# Patient Record
Sex: Female | Born: 1980 | Race: Black or African American | Hispanic: No | Marital: Married | Smoking: Former smoker
Health system: Southern US, Community
[De-identification: ages and names within clinical notes are randomized; demographics above are authoritative.]

## PROBLEM LIST (undated history)

## (undated) DIAGNOSIS — O009 Unspecified ectopic pregnancy without intrauterine pregnancy: Secondary | ICD-10-CM

## (undated) DIAGNOSIS — E119 Type 2 diabetes mellitus without complications: Secondary | ICD-10-CM

## (undated) DIAGNOSIS — I10 Essential (primary) hypertension: Secondary | ICD-10-CM

## (undated) DIAGNOSIS — T7840XA Allergy, unspecified, initial encounter: Secondary | ICD-10-CM

## (undated) HISTORY — DX: Essential (primary) hypertension: I10

## (undated) HISTORY — DX: Type 2 diabetes mellitus without complications: E11.9

## (undated) HISTORY — PX: TUBAL LIGATION: SHX77

## (undated) HISTORY — DX: Allergy, unspecified, initial encounter: T78.40XA

---

## 2001-08-29 ENCOUNTER — Other Ambulatory Visit: Admission: RE | Admit: 2001-08-29 | Discharge: 2001-08-29 | Payer: Self-pay | Admitting: Family Medicine

## 2003-06-24 ENCOUNTER — Encounter: Admission: RE | Admit: 2003-06-24 | Discharge: 2003-06-24 | Payer: Self-pay | Admitting: Obstetrics and Gynecology

## 2003-06-25 ENCOUNTER — Inpatient Hospital Stay (HOSPITAL_COMMUNITY): Admission: AD | Admit: 2003-06-25 | Discharge: 2003-06-25 | Payer: Self-pay | Admitting: Obstetrics and Gynecology

## 2003-08-08 ENCOUNTER — Other Ambulatory Visit: Admission: RE | Admit: 2003-08-08 | Discharge: 2003-08-08 | Payer: Self-pay | Admitting: Obstetrics and Gynecology

## 2003-08-08 ENCOUNTER — Inpatient Hospital Stay (HOSPITAL_COMMUNITY): Admission: AD | Admit: 2003-08-08 | Discharge: 2003-08-09 | Payer: Self-pay | Admitting: Obstetrics and Gynecology

## 2003-08-19 ENCOUNTER — Inpatient Hospital Stay (HOSPITAL_COMMUNITY): Admission: AD | Admit: 2003-08-19 | Discharge: 2003-08-20 | Payer: Self-pay | Admitting: Obstetrics and Gynecology

## 2003-08-29 ENCOUNTER — Inpatient Hospital Stay (HOSPITAL_COMMUNITY): Admission: AD | Admit: 2003-08-29 | Discharge: 2003-08-29 | Payer: Self-pay | Admitting: Obstetrics and Gynecology

## 2003-08-31 ENCOUNTER — Inpatient Hospital Stay (HOSPITAL_COMMUNITY): Admission: AD | Admit: 2003-08-31 | Discharge: 2003-09-02 | Payer: Self-pay | Admitting: Obstetrics and Gynecology

## 2003-09-12 ENCOUNTER — Inpatient Hospital Stay (HOSPITAL_COMMUNITY): Admission: AD | Admit: 2003-09-12 | Discharge: 2003-09-12 | Payer: Self-pay | Admitting: Obstetrics and Gynecology

## 2004-06-15 ENCOUNTER — Emergency Department (HOSPITAL_COMMUNITY): Admission: EM | Admit: 2004-06-15 | Discharge: 2004-06-15 | Payer: Self-pay | Admitting: Emergency Medicine

## 2004-09-03 ENCOUNTER — Other Ambulatory Visit: Admission: RE | Admit: 2004-09-03 | Discharge: 2004-09-03 | Payer: Self-pay | Admitting: Obstetrics and Gynecology

## 2005-03-04 ENCOUNTER — Encounter (HOSPITAL_COMMUNITY): Admission: RE | Admit: 2005-03-04 | Discharge: 2005-04-03 | Payer: Self-pay | Admitting: Obstetrics and Gynecology

## 2005-03-28 ENCOUNTER — Encounter: Admission: RE | Admit: 2005-03-28 | Discharge: 2005-03-28 | Payer: Self-pay | Admitting: Obstetrics and Gynecology

## 2005-04-08 ENCOUNTER — Encounter (HOSPITAL_COMMUNITY): Admission: RE | Admit: 2005-04-08 | Discharge: 2005-05-08 | Payer: Self-pay | Admitting: Obstetrics and Gynecology

## 2005-04-12 ENCOUNTER — Ambulatory Visit (HOSPITAL_COMMUNITY): Admission: RE | Admit: 2005-04-12 | Discharge: 2005-04-12 | Payer: Self-pay | Admitting: Obstetrics and Gynecology

## 2005-05-03 ENCOUNTER — Inpatient Hospital Stay (HOSPITAL_COMMUNITY): Admission: AD | Admit: 2005-05-03 | Discharge: 2005-05-04 | Payer: Self-pay | Admitting: Obstetrics and Gynecology

## 2005-05-20 ENCOUNTER — Encounter (HOSPITAL_COMMUNITY): Admission: AD | Admit: 2005-05-20 | Discharge: 2005-06-19 | Payer: Self-pay | Admitting: Obstetrics and Gynecology

## 2005-05-24 ENCOUNTER — Inpatient Hospital Stay (HOSPITAL_COMMUNITY): Admission: AD | Admit: 2005-05-24 | Discharge: 2005-05-30 | Payer: Self-pay | Admitting: Obstetrics and Gynecology

## 2005-05-25 ENCOUNTER — Ambulatory Visit: Payer: Self-pay | Admitting: Neonatology

## 2005-06-03 ENCOUNTER — Inpatient Hospital Stay (HOSPITAL_COMMUNITY): Admission: AD | Admit: 2005-06-03 | Discharge: 2005-06-03 | Payer: Self-pay | Admitting: Obstetrics and Gynecology

## 2005-06-14 ENCOUNTER — Inpatient Hospital Stay (HOSPITAL_COMMUNITY): Admission: AD | Admit: 2005-06-14 | Discharge: 2005-06-15 | Payer: Self-pay | Admitting: Obstetrics and Gynecology

## 2005-06-21 ENCOUNTER — Inpatient Hospital Stay (HOSPITAL_COMMUNITY): Admission: AD | Admit: 2005-06-21 | Discharge: 2005-06-22 | Payer: Self-pay | Admitting: Obstetrics and Gynecology

## 2005-06-25 ENCOUNTER — Inpatient Hospital Stay (HOSPITAL_COMMUNITY): Admission: AD | Admit: 2005-06-25 | Discharge: 2005-06-25 | Payer: Self-pay | Admitting: Obstetrics and Gynecology

## 2005-07-03 ENCOUNTER — Inpatient Hospital Stay (HOSPITAL_COMMUNITY): Admission: AD | Admit: 2005-07-03 | Discharge: 2005-07-03 | Payer: Self-pay | Admitting: Obstetrics and Gynecology

## 2005-07-05 ENCOUNTER — Inpatient Hospital Stay (HOSPITAL_COMMUNITY): Admission: AD | Admit: 2005-07-05 | Discharge: 2005-07-07 | Payer: Self-pay | Admitting: Obstetrics and Gynecology

## 2005-09-13 ENCOUNTER — Other Ambulatory Visit: Admission: RE | Admit: 2005-09-13 | Discharge: 2005-09-13 | Payer: Self-pay | Admitting: Obstetrics and Gynecology

## 2006-01-26 ENCOUNTER — Emergency Department (HOSPITAL_COMMUNITY): Admission: EM | Admit: 2006-01-26 | Discharge: 2006-01-26 | Payer: Self-pay | Admitting: Emergency Medicine

## 2014-07-13 ENCOUNTER — Encounter (HOSPITAL_COMMUNITY): Payer: Self-pay | Admitting: *Deleted

## 2014-07-13 ENCOUNTER — Emergency Department (HOSPITAL_COMMUNITY)
Admission: EM | Admit: 2014-07-13 | Discharge: 2014-07-13 | Disposition: A | Discharge status: Home / Self Care | Attending: Emergency Medicine | Admitting: Emergency Medicine

## 2014-07-13 DIAGNOSIS — L02212 Cutaneous abscess of back [any part, except buttock]: Secondary | ICD-10-CM | POA: Diagnosis not present

## 2014-07-13 DIAGNOSIS — R110 Nausea: Secondary | ICD-10-CM | POA: Insufficient documentation

## 2014-07-13 DIAGNOSIS — R630 Anorexia: Secondary | ICD-10-CM | POA: Insufficient documentation

## 2014-07-13 DIAGNOSIS — L089 Local infection of the skin and subcutaneous tissue, unspecified: Secondary | ICD-10-CM | POA: Diagnosis present

## 2014-07-13 DIAGNOSIS — Z3202 Encounter for pregnancy test, result negative: Secondary | ICD-10-CM | POA: Insufficient documentation

## 2014-07-13 DIAGNOSIS — Z720 Tobacco use: Secondary | ICD-10-CM | POA: Diagnosis not present

## 2014-07-13 LAB — POC URINE PREG, ED: Preg Test, Ur: NEGATIVE

## 2014-07-13 MED ORDER — SULFAMETHOXAZOLE-TRIMETHOPRIM 800-160 MG PO TABS: 1.0000 | ORAL_TABLET | Freq: Two times a day (BID) | ORAL | Status: AC

## 2014-07-13 MED ORDER — OXYCODONE-ACETAMINOPHEN 5-325 MG PO TABS
1.0000 | ORAL_TABLET | Freq: Once | ORAL | Status: AC
  Administered 2014-07-13: 1 via ORAL
  Filled 2014-07-13: qty 1

## 2014-07-13 MED ORDER — LIDOCAINE HCL (PF) 2 % IJ SOLN
0.0000 mL | Freq: Once | INTRAMUSCULAR | Status: AC | PRN
  Administered 2014-07-13: 10 mL via INTRADERMAL
  Filled 2014-07-13: qty 10

## 2014-07-13 NOTE — ED Notes (Signed)
Pt reports having an abcess on her rt shoulder blade. She first noticed this Wednesday and reports drainage this morning.

## 2014-07-13 NOTE — ED Provider Notes (Signed)
CSN: [id number]     Arrival date & time 07/13/14  1132 History  This chart was scribed for non-physician practitioner, Oswaldo ConroyVictoria Anjulie Dipierro, PA-C, working with Lorre NickAnthony Allen, MD, by Ronney LionSuzanne Le, ED Scribe. This patient was seen in room TR05C/TR05C and the patient's care was started at 1:57 PM.   Chief Complaint  Patient presents with  . Recurrent Skin Infections   The history is provided by the patient. No language interpreter was used.    HPI Comments: Kimberly Young is a 34 y.o. female who presents to the Emergency Department complaining of a constant boil on her right shoulder blade that she first noticed 3 days ago. She states that this morning, she woke up with brown discharge on her shirt. Patient complains of severe, associated pain to the area, nausea, and loss of appetite. Patient has tried wiping the area with alcohol, peroxide, and Neosporin. She had an abscess on her leg in the past before, but it was not as tender as this one. Patient has a history of gestational DM, but otherwise no DM. She denies vomiting. Patient has NKDA to antibiotics. She denies any likelihood of pregnancy, as she uses protection with sexual activity.  History reviewed. No pertinent past medical history. History reviewed. No pertinent past surgical history. No family history on file. History  Substance Use Topics  . Smoking status: Current Some Day Smoker  . Smokeless tobacco: Not on file  . Alcohol Use: Yes   OB History    No data available     Review of Systems  Constitutional: Positive for appetite change. Negative for fever.  Gastrointestinal: Positive for nausea. Negative for vomiting.  Neurological: Negative for seizures and headaches.      Allergies  Review of patient's allergies indicates no known allergies.  Home Medications   Prior to Admission medications   Medication Sig Start Date End Date Taking? Authorizing Provider  sulfamethoxazole-trimethoprim (BACTRIM DS,SEPTRA DS) 800-160 MG  per tablet Take 1 tablet by mouth 2 (two) times daily. 07/13/14 07/20/14  Oswaldo ConroyVictoria Blaiden Werth, PA-C   BP 122/68 mmHg  Pulse 74  Temp(Src) 97.8 F (36.6 C) (Oral)  Resp 16  Ht 5\' 6"  (1.676 m)  Wt 155 lb (70.308 kg)  BMI 25.03 kg/m2  SpO2 98% Physical Exam  Constitutional: She appears well-developed and well-nourished. No distress.  HENT:  Head: Normocephalic and atraumatic.  Eyes: Conjunctivae are normal. Right eye exhibits no discharge. Left eye exhibits no discharge.  Pulmonary/Chest: Effort normal. No respiratory distress.  Neurological: She is alert. Coordination normal.  Skin: She is not diaphoretic.  2-3 cm fluctuant abscess to right back near shoulder blade, with purulent drainage, surrounding induration, and evidence of moderate cellulitis. No red streaks.  Psychiatric: She has a normal mood and affect. Her behavior is normal.  Nursing note and vitals reviewed.   ED Course  Procedures (including critical care time)  DIAGNOSTIC STUDIES: Oxygen Saturation is 98% on RA, normal by my interpretation.    COORDINATION OF CARE: 2:01 PM - Discussed treatment plan with pt at bedside which includes I&D and Rx antibiotics, and pt agreed to plan.  INCISION AND DRAINAGE PROCEDURE NOTE: Patient identification was confirmed and verbal consent was obtained. This procedure was performed by Oswaldo ConroyVictoria Ory Elting, PA-C, working with Lorre NickAnthony Allen, MD at 2:20 PM. Site: right upper back Sterile procedures observed Needle size: 22 Anesthetic used (type and amt): 2% Lidocaine without Epinephrine Blade size: 11 Drainage: copious purulent Complexity: Complex Packing used Site anesthetized, incision made over site,  wound drained and explored loculations, rinsed with copious amounts of normal saline, wound packed with sterile gauze, covered with dry, sterile dressing.  Pt tolerated procedure well without complications.  Instructions for care discussed verbally and pt provided with additional  written instructions for homecare and f/u.   Meds given in ED:  Medications  lidocaine (XYLOCAINE) 2 % injection 0-20 mL (10 mLs Intradermal Given 07/13/14 1416)  oxyCODONE-acetaminophen (PERCOCET/ROXICET) 5-325 MG per tablet 1 tablet (1 tablet Oral Given 07/13/14 1453)    Discharge Medication List as of 07/13/2014  2:48 PM    START taking these medications   Details  sulfamethoxazole-trimethoprim (BACTRIM DS,SEPTRA DS) 800-160 MG per tablet Take 1 tablet by mouth 2 (two) times daily., Starting 07/13/2014, Until Sun 07/20/14, Print          MDM   Final diagnoses:  Abscess of back   Patient with skin abscess amenable to incision and drainage.  Abscess with packing in place. Wound recheck in 2 days. Pt tolerated procedure well. Encouraged home warm soaks and flushing.  Moderate signs of cellulitis is surrounding skin.  Will d/c to home with bactrim.  Discussed return precautions with patient. Patient verbalizes understanding and agrees with plan.  I personally performed the services described in this documentation, which was scribed in my presence. The recorded information has been reviewed and is accurate.   Oswaldo Conroy, PA-C 07/13/14 1650  Lorre Nick, MD 07/15/14 1137

## 2014-07-13 NOTE — Discharge Instructions (Signed)
Return to the emergency room with worsening of symptoms, new symptoms or with symptoms that are concerning, especially fevers, chills, vomiting, numbness, tingling. Please take all of your antibiotics until finished!   You may develop abdominal discomfort or diarrhea from the antibiotic.  You may help offset this with probiotics which you can buy or get in yogurt. Do not eat  or take the probiotics until 2 hours after your antibiotic.  Return to ED or urgent care or primary doctor in 2 days for wound recheck. Warm soaks three times daily Read below information and follow recommendations.   Abscess Care After An abscess (also called a boil or furuncle) is an infected area that contains a collection of pus. Signs and symptoms of an abscess include pain, tenderness, redness, or hardness, or you may feel a moveable soft area under your skin. An abscess can occur anywhere in the body. The infection may spread to surrounding tissues causing cellulitis. A cut (incision) by the surgeon was made over your abscess and the pus was drained out. Gauze may have been packed into the space to provide a drain that will allow the cavity to heal from the inside outwards. The boil may be painful for 5 to 7 days. Most people with a boil do not have high fevers. Your abscess, if seen early, may not have localized, and may not have been lanced. If not, another appointment may be required for this if it does not get better on its own or with medications. HOME CARE INSTRUCTIONS   Only take over-the-counter or prescription medicines for pain, discomfort, or fever as directed by your caregiver.  When you bathe, soak and then remove gauze or iodoform packs at least daily or as directed by your caregiver. You may then wash the wound gently with mild soapy water. Repack with gauze or do as your caregiver directs. SEEK IMMEDIATE MEDICAL CARE IF:   You develop increased pain, swelling, redness, drainage, or bleeding in the wound  site.  You develop signs of generalized infection including muscle aches, chills, fever, or a general ill feeling.  An oral temperature above 102 F (38.9 C) develops, not controlled by medication. See your caregiver for a recheck if you develop any of the symptoms described above. If medications (antibiotics) were prescribed, take them as directed. Document Released: 08/12/2004 Document Revised: 04/18/2011 Document Reviewed: 04/09/2007 [facility] Patient Information 2015 [employer] [place]. This information is not intended to replace advice given to you by your health care provider. Make sure you discuss any questions you have with your health care provider.

## 2014-07-13 NOTE — ED Notes (Signed)
Declined W/C at D/C and was escorted to lobby by RN. 

## 2014-07-13 NOTE — ED Notes (Signed)
Kimberly EisenmengerFelix in mini lab states POC urine test is negative.

## 2014-07-17 ENCOUNTER — Emergency Department (HOSPITAL_COMMUNITY)
Admission: EM | Admit: 2014-07-17 | Discharge: 2014-07-17 | Disposition: A | Discharge status: Home / Self Care | Attending: Emergency Medicine | Admitting: Emergency Medicine

## 2014-07-17 ENCOUNTER — Encounter (HOSPITAL_COMMUNITY): Payer: Self-pay | Admitting: *Deleted

## 2014-07-17 DIAGNOSIS — Z720 Tobacco use: Secondary | ICD-10-CM | POA: Insufficient documentation

## 2014-07-17 DIAGNOSIS — L02212 Cutaneous abscess of back [any part, except buttock]: Secondary | ICD-10-CM | POA: Insufficient documentation

## 2014-07-17 DIAGNOSIS — Z4801 Encounter for change or removal of surgical wound dressing: Secondary | ICD-10-CM | POA: Diagnosis present

## 2014-07-17 DIAGNOSIS — Z5189 Encounter for other specified aftercare: Secondary | ICD-10-CM

## 2014-07-17 HISTORY — DX: Unspecified ectopic pregnancy without intrauterine pregnancy: O00.90

## 2014-07-17 MED ORDER — LIDOCAINE HCL (PF) 1 % IJ SOLN
5.0000 mL | Freq: Once | INTRAMUSCULAR | Status: DC
  Filled 2014-07-17: qty 5

## 2014-07-17 NOTE — ED Provider Notes (Signed)
CSN: [id number]     Arrival date & time 07/17/14  0211 History   This chart was scribed for non-physician practitioner, Emilia Beck, PA-C working with Gilda Crease, MD by Freida Busman, ED Scribe. This patient was seen in room TR09C/TR09C and the patient's care was started at 7:30 PM.    Chief Complaint  Patient presents with  . Wound Check   The history is provided by the patient. No language interpreter was used.     HPI Comments:  Kimberly Young is a 34 y.o. female who presents to the Emergency Department for a wound check. Pt had an abscess to her right upper back incised and drained on 07/13/14. She states the pain to the area has improved but notes mild continued pain to the site. At this time she is complaining of significant drainage from the site. She has been changing the dressing 3-4 times a day.Pt has been taking her antibiotics as prescribed. She denies fever.    Past Medical History  Diagnosis Date  . Ectopic pregnancy    Past Surgical History  Procedure Laterality Date  . Tubal ligation Left     emergency   No family history on file. History  Substance Use Topics  . Smoking status: Current Some Day Smoker    Types: Cigarettes  . Smokeless tobacco: Not on file  . Alcohol Use: Yes   OB History    No data available     Review of Systems  Constitutional: Negative for fever.  Skin: Positive for wound (Right Upper Back).  All other systems reviewed and are negative.     Allergies  Review of patient's allergies indicates no known allergies.  Home Medications   Prior to Admission medications   Medication Sig Start Date End Date Taking? Authorizing Provider  sulfamethoxazole-trimethoprim (BACTRIM DS,SEPTRA DS) 800-160 MG per tablet Take 1 tablet by mouth 2 (two) times daily. 07/13/14 07/20/14  Oswaldo Conroy, PA-C   BP 116/76 mmHg  Pulse 95  Temp(Src) 98.4 F (36.9 C) (Oral)  Resp 16  Ht 5\' 6"  (1.676 m)  Wt 155 lb (70.308 kg)  BMI 25.03  kg/m2  SpO2 98%  LMP 07/13/2014 Physical Exam  Constitutional: She is oriented to person, place, and time. She appears well-developed and well-nourished. No distress.  HENT:  Head: Normocephalic and atraumatic.  Eyes: Conjunctivae and EOM are normal.  Neck: Normal range of motion. No tracheal deviation present.  Cardiovascular: Normal rate.   Pulmonary/Chest: Effort normal. No respiratory distress. She has no wheezes.  Abdominal: Soft. She exhibits no distension. There is no tenderness.  Musculoskeletal: Normal range of motion. She exhibits no tenderness.  Neurological: She is alert and oriented to person, place, and time. Coordination normal.  Skin: Skin is warm and dry. There is erythema.  6 x 6 cm area of erythema and edema and TTP with central incision wound and purulent drainage    Psychiatric: She has a normal mood and affect. Her behavior is normal.  Nursing note and vitals reviewed.   ED Course  Procedures   INCISION AND DRAINAGE Performed by: Emilia Beck Consent: Verbal consent obtained. Risks and benefits: risks, benefits and alternatives were discussed Type: abscess  Body area: right upper back  Anesthesia: local infiltration  Incision was made with a scalpel.  Local anesthetic: lidocaine 1% without epinephrine  Anesthetic total: 3 ml  Complexity: complex Blunt dissection to break up loculations  Drainage: purulent  Drainage amount: copious  Packing material: none  Patient  tolerance: Patient tolerated the procedure well with no immediate complications.     DIAGNOSTIC STUDIES:  Oxygen Saturation is 98% on RA, normal by my interpretation.    COORDINATION OF CARE:  7:33 PM Will open up the wound.  Discussed treatment plan with pt at bedside and pt agreed to plan.  Labs Review Labs Reviewed - No data to display  Imaging Review No results found.   EKG Interpretation None      MDM   Final diagnoses:  Wound check, abscess     Patient's abscess drained which produced more copious drainage. Patient instructed to continue antibiotics and return with worsening or concerning symptoms.   I personally performed the services described in this documentation, which was scribed in my presence. The recorded information has been reviewed and is accurate.      Emilia Beck, PA-C 07/17/14 2224  Gilda Crease, MD 07/18/14 [id number]

## 2014-07-17 NOTE — ED Notes (Addendum)
Pt had abscess lanced on Sunday.  She states pain greatly improved, but still has copious amount of purulent drainage.  Has been taking antibiotics as prescribed.

## 2015-10-02 ENCOUNTER — Ambulatory Visit (HOSPITAL_COMMUNITY)
Admission: EM | Admit: 2015-10-02 | Discharge: 2015-10-02 | Disposition: A | Discharge status: Home / Self Care | Attending: Family Medicine | Admitting: Family Medicine

## 2015-10-02 ENCOUNTER — Encounter (HOSPITAL_COMMUNITY): Payer: Self-pay | Admitting: Emergency Medicine

## 2015-10-02 DIAGNOSIS — Z87898 Personal history of other specified conditions: Secondary | ICD-10-CM

## 2015-10-02 DIAGNOSIS — R7309 Other abnormal glucose: Secondary | ICD-10-CM

## 2015-10-02 DIAGNOSIS — R030 Elevated blood-pressure reading, without diagnosis of hypertension: Secondary | ICD-10-CM

## 2015-10-02 DIAGNOSIS — IMO0001 Reserved for inherently not codable concepts without codable children: Secondary | ICD-10-CM

## 2015-10-02 LAB — POCT I-STAT, CHEM 8
BUN: 9 mg/dL (ref 6–20)
CALCIUM ION: 1.2 mmol/L (ref 1.13–1.30)
CHLORIDE: 97 mmol/L — AB (ref 101–111)
CREATININE: 0.4 mg/dL — AB (ref 0.44–1.00)
GLUCOSE: 382 mg/dL — AB (ref 65–99)
HCT: 41 % (ref 36.0–46.0)
Hemoglobin: 13.9 g/dL (ref 12.0–15.0)
Potassium: 4 mmol/L (ref 3.5–5.1)
Sodium: 134 mmol/L — ABNORMAL LOW (ref 135–145)
TCO2: 24 mmol/L (ref 0–100)

## 2015-10-02 NOTE — Discharge Instructions (Signed)
Your heart rate was normal today in office.  Your EKG and blood work was normal today.  There is no evidence of irregular heart beats/ arrhythmias.  Your electrolytes are within normal limits.  I suspect that your increased heart rate is associated with anxiety/ stress.  I recommend that you establish with a primary care doctor , who can do a more extensive investigation if you develop symptoms.  I recommend that you also have your blood pressure followed up on.  It was slightly elevated during today's visit.  Your blood sugar was also elevated.

## 2015-10-02 NOTE — ED Provider Notes (Signed)
CSN: [id number]     Arrival date & time 10/02/15  1249 History   First MD Initiated Contact with Patient 10/02/15 1345     Chief Complaint  Patient presents with  . Irregular Heart Beat   (Consider location/radiation/quality/duration/timing/severity/associated sxs/prior Treatment) HPI Patient reports that she was told by the plasma donation center that she needed to have her pulse evaluated, as it has been elevated the last couple of times.  She reports increased stress from losing her job, and notes that she was dependent upon the donation for income.  Denies palpitations, dizziness, CP, SOB, heavy menses, LOC.  No family h/o thyroid problems, irregular heart beats.  No medications, no drug use, social ETOH use.  Past Medical History:  Diagnosis Date  . Ectopic pregnancy    Past Surgical History:  Procedure Laterality Date  . TUBAL LIGATION Left    emergency   History reviewed. No pertinent family history. Social History  Substance Use Topics  . Smoking status: Current Some Day Smoker    Types: Cigarettes  . Smokeless tobacco: Never Used  . Alcohol use Yes   OB History    No data available     Review of Systems  Constitutional: Negative for appetite change, chills, fatigue and fever.  Eyes: Negative for visual disturbance.  Respiratory: Negative for chest tightness and shortness of breath.   Cardiovascular: Negative for chest pain, palpitations and leg swelling.  Gastrointestinal: Negative for constipation and diarrhea.  Endocrine: Negative for cold intolerance and heat intolerance.  Genitourinary: Negative for vaginal bleeding (no abnormal).  Skin: Negative for pallor.  Neurological: Negative for dizziness and syncope.  Psychiatric/Behavioral: The patient is nervous/anxious (has been more stressed lately).    Allergies  Review of patient's allergies indicates no known allergies.  Home Medications   Prior to Admission medications   Not on File   Meds Ordered and  Administered this Visit  Medications - No data to display  BP 136/96 (BP Location: Left Arm)   Pulse 95   Temp 98.4 F (36.9 C) (Oral)   Resp 16   LMP 09/08/2015   SpO2 100%  No data found.  Physical Exam  Constitutional: She is oriented to person, place, and time. She appears well-developed and well-nourished. No distress.  HENT:  Head: Normocephalic and atraumatic.  Eyes: Conjunctivae and EOM are normal.  Neck: Normal range of motion. Neck supple. No thyromegaly present.  Cardiovascular: Normal rate, regular rhythm, normal heart sounds and intact distal pulses.   No murmur heard. Pulmonary/Chest: Effort normal and breath sounds normal. No respiratory distress.  Musculoskeletal: Normal range of motion. She exhibits no edema.  Neurological: She is alert and oriented to person, place, and time.  Skin: Skin is warm. Capillary refill takes less than 2 seconds. No rash noted. She is not diaphoretic.  Psychiatric: She has a normal mood and affect. Her behavior is normal. Judgment and thought content normal.    Urgent Care Course   Clinical Course   1353: EKG and istat Chem 8 ordered  1405: EKG sinus, no arhythmia appreciated, HR 98  Procedures (including critical care time)  Labs Review Labs Reviewed  POCT I-STAT, CHEM 8 - Abnormal; Notable for the following:       Result Value   Sodium 134 (*)    Chloride 97 (*)    Creatinine, Ser 0.40 (*)    Glucose, Bld 382 (*)    All other components within normal limits    Imaging Review No results  found.   MDM   1. History of tachycardia   2. Elevated blood pressure   3. Elevated glucose     Kimberly Young is a 35 y.o. female that presents to UC for remote h/o tachycardia.  Her HR is normal at today's visit.  She is completely asymptomatic.  Her physical exam is unremarkable.  An EKG and electrolyte panel were obtained, EKG within normal limits.  Corrected sodium WNL.  Glucose elevated (patient was eating while in  room).  Suspect situation tachycardia related to anxiety.  Form provided by donation center signed.  Encouraged patient to establish with a PCP for continued monitoring of elevated BP.  This patient was discussed with Nils Pyle, NP, who agrees with my assessment and plan.  Geneva Barrero M. Nadine Counts, DO PGY-3, [facility]         Raliegh Ip, DO 10/02/15 [phone]

## 2015-10-02 NOTE — ED Notes (Signed)
Pt d/c by provider.

## 2015-10-02 NOTE — ED Triage Notes (Signed)
Pt reports elevated HR onset early July  Reports she was donating plasma and was told that she needs to be assessed for elevated HR  She is asymptomatic... Pulse today = 95  A&O x4... NAD

## 2016-09-24 ENCOUNTER — Ambulatory Visit (HOSPITAL_COMMUNITY)
Admission: EM | Admit: 2016-09-24 | Discharge: 2016-09-24 | Disposition: A | Discharge status: Home / Self Care | Payer: 59 | Attending: Family | Admitting: Family

## 2016-09-24 ENCOUNTER — Encounter (HOSPITAL_COMMUNITY): Payer: Self-pay | Admitting: Emergency Medicine

## 2016-09-24 DIAGNOSIS — L02212 Cutaneous abscess of back [any part, except buttock]: Secondary | ICD-10-CM | POA: Diagnosis not present

## 2016-09-24 DIAGNOSIS — R509 Fever, unspecified: Secondary | ICD-10-CM

## 2016-09-24 DIAGNOSIS — F17210 Nicotine dependence, cigarettes, uncomplicated: Secondary | ICD-10-CM | POA: Insufficient documentation

## 2016-09-24 DIAGNOSIS — R110 Nausea: Secondary | ICD-10-CM | POA: Diagnosis not present

## 2016-09-24 DIAGNOSIS — L0291 Cutaneous abscess, unspecified: Secondary | ICD-10-CM | POA: Diagnosis not present

## 2016-09-24 MED ORDER — MUPIROCIN 2 % EX OINT: 1.0000 "application " | TOPICAL_OINTMENT | Freq: Two times a day (BID) | CUTANEOUS | 0 refills | Status: DC

## 2016-09-24 MED ORDER — DOXYCYCLINE HYCLATE 100 MG PO TBEC: 100.0000 mg | DELAYED_RELEASE_TABLET | Freq: Two times a day (BID) | ORAL | 0 refills | Status: DC

## 2016-09-24 MED ORDER — LIDOCAINE-EPINEPHRINE (PF) 2 %-1:200000 IJ SOLN
INTRAMUSCULAR | Status: AC
  Filled 2016-09-24: qty 20

## 2016-09-24 NOTE — ED Triage Notes (Signed)
Pt here for abscess on back onset 3 days that's getting bigger and painful associated w/bloody drainage and fevers, chills, nausea  Denies vomiting, diarrhea  A&O x4... NAD... Ambulatory

## 2016-09-24 NOTE — Discharge Instructions (Signed)
Please return in 2 days for wound check  Continue to use warm compresses to encourage more purulent discharge to drain. As discussed, once this heals, may use Dial soap on a  regular basis and if you start to feel any beginnings of abscess, may start to use topical mupirocin ointment which I prescribed today.   Start doxycycline today. As discussed, do not a lot of time in the sinus medication. Easier for you to get a sunburn.  Ensure to take probiotics while on antibiotics and also for 2 weeks after completion. It is important to re-colonize the gut with good bacteria and also to prevent any diarrheal infections associated with antibiotic use.   If there is no improvement in your symptoms, or if there is any worsening of symptoms, or if you have any additional concerns, please return for re-evaluation; or, if we are closed, consider going to the Emergency Room for evaluation if symptoms urgent.

## 2016-09-24 NOTE — ED Provider Notes (Signed)
[facility]    CSN: [id number] Arrival date & time: 09/24/16  1204     History   Chief Complaint Chief Complaint  Patient presents with  . Abscess    HPI Kimberly Young is a 36 y.o. female.   Chief complaint: abscess on upper back, 5 days, worsening. Patient endorses bloody discharge, chills, fever, nausea NO tick. Unsure if got 'pinched' . Hasn't tried any medications; has tried warm compresses with some relief.  No concern for pregnancy  No h/o mrsa        Past Medical History:  Diagnosis Date  . Ectopic pregnancy     There are no active problems to display for this patient.   Past Surgical History:  Procedure Laterality Date  . TUBAL LIGATION Left    emergency    OB History    No data available       Home Medications    Prior to Admission medications   Medication Sig Start Date End Date Taking? Authorizing Provider  doxycycline (DORYX) 100 MG EC tablet Take 1 tablet (100 mg total) by mouth 2 (two) times daily. 09/24/16   Allegra Grana, FNP  mupirocin ointment (BACTROBAN) 2 % Place 1 application into the nose 2 (two) times daily. 09/24/16   Allegra Grana, FNP    Family History History reviewed. No pertinent family history.  Social History Social History  Substance Use Topics  . Smoking status: Current Some Day Smoker    Types: Cigarettes  . Smokeless tobacco: Never Used  . Alcohol use Yes     Allergies   Patient has no known allergies.   Review of Systems Review of Systems  Constitutional: Positive for fever. Negative for chills.  Respiratory: Negative for cough.   Cardiovascular: Negative for chest pain and palpitations.  Gastrointestinal: Positive for nausea. Negative for vomiting.     Physical Exam Triage Vital Signs ED Triage Vitals [09/24/16 1229]  Enc Vitals Group     BP 129/78     Pulse Rate 99     Resp 20     Temp 100.2 F (37.9 C)     Temp Source Oral     SpO2 98 %     Weight      Height        Head Circumference      Peak Flow      Pain Score 10     Pain Loc      Pain Edu?      Excl. in GC?    No data found.   Updated Vital Signs BP 129/78 (BP Location: Left Arm)   Pulse 99   Temp 100.2 F (37.9 C) (Oral)   Resp 20   LMP 09/24/2016   SpO2 98%   Visual Acuity Right Eye Distance:   Left Eye Distance:   Bilateral Distance:    Right Eye Near:   Left Eye Near:    Bilateral Near:     Physical Exam  Constitutional: She appears well-developed and well-nourished.  Eyes: Conjunctivae are normal.  Cardiovascular: Normal rate, regular rhythm, normal heart sounds and normal pulses.   Pulmonary/Chest: Effort normal and breath sounds normal. She has no wheezes. She has no rhonchi. She has no rales.  Neurological: She is alert.  Skin: Skin is warm and dry.     Psychiatric: She has a normal mood and affect. Her speech is normal and behavior is normal. Thought content normal.  Vitals reviewed.   Patient does  not have previous history of MRSA.  Patient does not have diabetes.   The patient gave informed consent for the procedure. Patient and I discussed risks, benefits, and alternatives to I & D.  Including risk of infection from laceration, localized pain, and bleeding. We discussed the option for oral antibiotics. Patient verbalized understanding to conversation and all questions were answered. We jointly decided to proceed with procedure.  On exam, there is a 4cm cm indurated abscess.  It is tender to palpation.  There is erythema with spontaneous purulent discharge discharge.  The patient gave informed consent for the procedure. The area was prepped with antiseptic solution. The area was anesthetized using lidocaine. The patient tolerated the anesthetic well.    #11 blade was used to incise the abscess.  Purulent bloody drainage was noted.  Bleeding from the wound was controlled by applying direct pressure with gauze.   Iodoform was used to pack the abscess.  A  bandage was placed.  Wound culture was obtained.  The patient tolerated the procedure well.  Extensive instructions were given to the patient.   Complications: None  Take antibiotic as directed.  Follow-up for wound check in 2-3 days.   UC Treatments / Results  Labs (all labs ordered are listed, but only abnormal results are displayed) Labs Reviewed  AEROBIC CULTURE (SUPERFICIAL SPECIMEN)    EKG  EKG Interpretation None       Radiology No results found.  Procedures Procedures (including critical care time)  Medications Ordered in UC Medications - No data to display   Initial Impression / Assessment and Plan / UC Course  I have reviewed the triage vital signs and the nursing notes.  Pertinent labs & imaging results that were available during my care of the patient were reviewed by me and considered in my medical decision making (see chart for details).       Final Clinical Impressions(s) / UC Diagnoses   Final diagnoses:  Abscess  Low-grade temperature today suspect from localized infection.. Patient is well-appearing. She tolerated incision and drainage well.  We'll also start on doxycycline today. Mupirocin given to patient for future abscess and education given on prevention ( dial soap). Encouraged to continue use warm compresses. Patient understands to follow-up for wound recheck in 1-2 days. Return precautions given.  New Prescriptions New Prescriptions   DOXYCYCLINE (DORYX) 100 MG EC TABLET    Take 1 tablet (100 mg total) by mouth 2 (two) times daily.   MUPIROCIN OINTMENT (BACTROBAN) 2 %    Place 1 application into the nose 2 (two) times daily.     Controlled Substance Prescriptions [place] Controlled Substance Registry consulted? Not Applicable   Allegra Grana, [place] 09/24/16 [phone]

## 2016-09-27 LAB — AEROBIC CULTURE W GRAM STAIN (SUPERFICIAL SPECIMEN)

## 2016-09-27 LAB — AEROBIC CULTURE  (SUPERFICIAL SPECIMEN)

## 2016-09-28 ENCOUNTER — Ambulatory Visit (HOSPITAL_COMMUNITY)
Admission: EM | Admit: 2016-09-28 | Discharge: 2016-09-28 | Disposition: A | Discharge status: Home / Self Care | Payer: 59 | Attending: Family Medicine | Admitting: Family Medicine

## 2016-09-28 ENCOUNTER — Encounter (HOSPITAL_COMMUNITY): Payer: Self-pay | Admitting: Emergency Medicine

## 2016-09-28 DIAGNOSIS — Z09 Encounter for follow-up examination after completed treatment for conditions other than malignant neoplasm: Secondary | ICD-10-CM

## 2016-09-28 DIAGNOSIS — Z5189 Encounter for other specified aftercare: Secondary | ICD-10-CM | POA: Diagnosis not present

## 2016-09-28 DIAGNOSIS — L02212 Cutaneous abscess of back [any part, except buttock]: Secondary | ICD-10-CM

## 2016-09-28 MED ORDER — HYDROCODONE-ACETAMINOPHEN 5-325 MG PO TABS: 1.0000 | ORAL_TABLET | ORAL | 0 refills | Status: DC | PRN

## 2016-09-28 NOTE — ED Triage Notes (Signed)
Pt here for abscess recheck.  Pt had an I&D done on Saturday and has been taking antibiotics as prescribed.  Pt reports pain and yellow discharge from the wound and continued fever.

## 2016-09-28 NOTE — Discharge Instructions (Signed)
He may change the dressing but try not to pull out the wick/packing. You may wash around the wound with water and alcohol. Replace with clean gauze for absorption. For any worsening, fever, increase in size return promptly. Otherwise, follow-up in 2-2-1/2 days. Continue the doxycycline. You have pain medicine prescribed to you that can cause drowsiness. Try not to get the dressing wet.

## 2016-09-28 NOTE — ED Provider Notes (Signed)
[facility]    CSN: [id number] Arrival date & time: 09/28/16  1001     History   Chief Complaint Chief Complaint  Patient presents with  . Wound Check    HPI Kimberly Young is a 36 y.o. female.   36 year old female presents today for wound check 4 days after she underwent an incision and drainage of an abscess to her right back. She states it is feeling worse. It is increased in size, pain is increased and Tylenol and ibuprofen and are not helping.      Past Medical History:  Diagnosis Date  . Ectopic pregnancy     There are no active problems to display for this patient.   Past Surgical History:  Procedure Laterality Date  . TUBAL LIGATION Left    emergency    OB History    No data available       Home Medications    Prior to Admission medications   Medication Sig Start Date End Date Taking? Authorizing Provider  doxycycline (DORYX) 100 MG EC tablet Take 1 tablet (100 mg total) by mouth 2 (two) times daily. 09/24/16  Yes Arnett, Lyn Records, FNP  mupirocin ointment (BACTROBAN) 2 % Place 1 application into the nose 2 (two) times daily. 09/24/16  Yes Arnett, Lyn Records, FNP  HYDROcodone-acetaminophen (NORCO/VICODIN) 5-325 MG tablet Take 1 tablet by mouth every 4 (four) hours as needed. 09/28/16   Hayden Rasmussen, NP    Family History History reviewed. No pertinent family history.  Social History Social History  Substance Use Topics  . Smoking status: Current Some Day Smoker    Types: Cigarettes  . Smokeless tobacco: Never Used  . Alcohol use Yes     Allergies   Patient has no known allergies.   Review of Systems Review of Systems  Constitutional: Negative.   Gastrointestinal: Negative.   Skin:       Abscess formation of the back.  Neurological: Negative.   All other systems reviewed and are negative.    Physical Exam Triage Vital Signs ED Triage Vitals [09/28/16 1032]  Enc Vitals Group     BP 108/76     Pulse Rate 92     Resp       Temp 98.5 F (36.9 C)     Temp Source Oral     SpO2 98 %     Weight      Height      Head Circumference      Peak Flow      Pain Score 6     Pain Loc      Pain Edu?      Excl. in GC?    No data found.   Updated Vital Signs BP 108/76 (BP Location: Left Arm)   Pulse 92   Temp 98.5 F (36.9 C) (Oral)   LMP 09/24/2016   SpO2 98%   Visual Acuity Right Eye Distance:   Left Eye Distance:   Bilateral Distance:    Right Eye Near:   Left Eye Near:    Bilateral Near:     Physical Exam  Constitutional: She is oriented to Young, place, and time. She appears well-developed and well-nourished. No distress.  Neck: Neck supple.  Cardiovascular: Normal rate.   Pulmonary/Chest: Effort normal.  Musculoskeletal: Normal range of motion. She exhibits no deformity.  Neurological: She is alert and oriented to Young, place, and time.  Skin: Skin is warm. No rash noted.  The area of abscess that  was I&D 4 days ago has closed, it has increased in size and is formed a rather large pocket of pus, tenderness and discoloration. Surrounding minor erythema.Marland Kitchen Positive for fluctuance and tenderness. No evidence of packing.  Nursing note and vitals reviewed.    UC Treatments / Results  Labs (all labs ordered are listed, but only abnormal results are displayed) Labs Reviewed - No data to display  EKG  EKG Interpretation None       Radiology No results found.  Procedures .Marland KitchenIncision and Drainage Date/Time: 09/28/2016 11:47 AM Performed by: Phineas Real, Simrah Chatham Authorized by: Mardella Layman   Consent:    Consent obtained:  Verbal   Consent given by:  Patient   Risks discussed:  Bleeding, pain and infection Location:    Type:  Abscess   Location:  Trunk   Trunk location:  Back Pre-procedure details:    Skin preparation:  Betadine Anesthesia (see MAR for exact dosages):    Anesthesia method:  Local infiltration   Local anesthetic:  Lidocaine 2% w/o epi and bupivacaine 0.5% WITH  epi Procedure type:    Complexity:  Complex Procedure details:    Needle aspiration: no     Incision types:  Single with marsupialization   Incision depth:  Subcutaneous   Scalpel blade:  11   Wound management:  Probed and deloculated   Drainage:  Bloody and purulent   Drainage amount:  Copious   Wound treatment:  Drain placed   Packing materials:  1/4 in gauze Post-procedure details:    Patient tolerance of procedure:  Tolerated well, no immediate complications Comments:     Copious amount of purulence was expressed from the abscess.   (including critical care time)  Medications Ordered in UC Medications - No data to display   Initial Impression / Assessment and Plan / UC Course  I have reviewed the triage vital signs and the nursing notes.  Pertinent labs & imaging results that were available during my care of the patient were reviewed by me and considered in my medical decision making (see chart for details).     He may change the dressing but try not to pull out the wick/packing. You may wash around the wound with water and alcohol. Replace with clean gauze for absorption. For any worsening, fever, increase in size return promptly. Otherwise, follow-up in 2-2-1/2 days. Continue the doxycycline. You have pain medicine prescribed to you that can cause drowsiness. Try not to get the dressing wet.   Final Clinical Impressions(s) / UC Diagnoses   Final diagnoses:  Wound check, abscess  Encounter for recheck of abscess following incision and drainage    New Prescriptions New Prescriptions   HYDROCODONE-ACETAMINOPHEN (NORCO/VICODIN) 5-325 MG TABLET    Take 1 tablet by mouth every 4 (four) hours as needed.     Controlled Substance Prescriptions [place] Controlled Substance Registry consulted? Yes, I have consulted the [place] Controlled Substances Registry for this patient, and feel the risk/benefit ratio today is favorable for proceeding with this prescription for a controlled  substance.   Hayden Rasmussen, NP 09/28/16 1156

## 2017-10-06 ENCOUNTER — Emergency Department (HOSPITAL_COMMUNITY)
Admission: EM | Admit: 2017-10-06 | Discharge: 2017-10-06 | Disposition: A | Discharge status: Home / Self Care | Payer: 59 | Attending: Emergency Medicine | Admitting: Emergency Medicine

## 2017-10-06 ENCOUNTER — Encounter (HOSPITAL_COMMUNITY): Payer: Self-pay

## 2017-10-06 ENCOUNTER — Emergency Department (HOSPITAL_COMMUNITY): Payer: 59

## 2017-10-06 ENCOUNTER — Other Ambulatory Visit: Payer: Self-pay

## 2017-10-06 DIAGNOSIS — F17210 Nicotine dependence, cigarettes, uncomplicated: Secondary | ICD-10-CM | POA: Diagnosis not present

## 2017-10-06 DIAGNOSIS — M25562 Pain in left knee: Secondary | ICD-10-CM | POA: Diagnosis not present

## 2017-10-06 DIAGNOSIS — M25512 Pain in left shoulder: Secondary | ICD-10-CM | POA: Insufficient documentation

## 2017-10-06 MED ORDER — ACETAMINOPHEN 500 MG PO TABS: 500.0000 mg | ORAL_TABLET | Freq: Four times a day (QID) | ORAL | 0 refills | Status: DC | PRN

## 2017-10-06 MED ORDER — IBUPROFEN 600 MG PO TABS: 600.0000 mg | ORAL_TABLET | Freq: Four times a day (QID) | ORAL | 0 refills | Status: DC | PRN
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
## 2017-12-08 ENCOUNTER — Encounter: Payer: 59 | Attending: *Deleted | Admitting: Registered"

## 2017-12-08 ENCOUNTER — Encounter: Payer: Self-pay | Admitting: Registered"

## 2017-12-08 DIAGNOSIS — Z713 Dietary counseling and surveillance: Secondary | ICD-10-CM | POA: Insufficient documentation

## 2017-12-08 DIAGNOSIS — E119 Type 2 diabetes mellitus without complications: Secondary | ICD-10-CM | POA: Diagnosis not present

## 2017-12-08 NOTE — Patient Instructions (Signed)
-   Add protein to breakfast option.   - Add in lunch option between breakfast and dinner. See page 15 in book as guide to include protein, vegetables, and carbohydrates.

## 2017-12-08 NOTE — Progress Notes (Signed)
Diabetes Self-Management Education  Visit Type:  First/Initial  Appt. Start Time: 9:23 Appt. End Time: 10:15  12/08/2017  [name], identified by name and date of birth, is a 37 y.o. female with a diagnosis of Diabetes: Type 2.   ASSESSMENT  Pt expectations: meal options, what she can/cannot eat, awareness of diabetes-related complications  Pt states she had gestational diabetes with 2 children. Pt states is currently not checking blood sugar but will begin once she returns to doctor in Dec after assessing how medication and behavioral changes are affecting her.   Pt states she works at [employer] [place] M-F 1-10pm, loves it.  Pt states she typically sleeps about 8 hours/night; wakes up at 9:30am. Pt reports eating breakfast (cereal) soon after waking. Pt states her mom lives with her and cooks sometimes. Pt states she was drinking a lot of juice and sodas prior to being diagnosed with diabetes, has made changes since then.   Next visit: physical activity, chronic complications, etc. (pg 16+ in book)  There were no vitals taken for this visit. There is no height or weight on file to calculate BMI.   Diabetes Self-Management Education - 12/08/17 0928      Health Coping   How would you rate your overall health?  Good      Psychosocial Assessment   Patient Belief/Attitude about Diabetes  Motivated to manage diabetes    Self-care barriers  None    Self-management support  Family;Friends    Patient Concerns  Nutrition/Meal planning    Special Needs  None    Preferred Learning Style  No preference indicated    Learning Readiness  Ready      Complications   Last HgB A1C per patient/outside source  14 %    How often do you check your blood sugar?  0 times/day (not testing)    Number of hypoglycemic episodes per month  0    Number of hyperglycemic episodes per week  1   only during menstrual cycle   Can you tell when your blood sugar is high?  No    Have you had a dilated  eye exam in the past 12 months?  Yes    Have you had a dental exam in the past 12 months?  Yes    Are you checking your feet?  Yes    How many days per week are you checking your feet?  7      Dietary Intake   Breakfast  cereal     Snack (morning)  none    Lunch  tangerine or nut grain snack or fruit snack from vending machine    Snack (afternoon)  none    Dinner  burger + side salad or chicken + rice + green beans    Snack (evening)  sometimes chips    Beverage(s)  unsweetened almond milk, water, diet coke, diet ginger ale      Exercise   Exercise Type  ADL's    How many days per week to you exercise?  0    How many minutes per day do you exercise?  0    Total minutes per week of exercise  0      Patient Education   Previous Diabetes Education  Yes (please comment)   2013   Disease state   Definition of diabetes, type 1 and 2, and the diagnosis of diabetes;Factors that contribute to the development of diabetes    Nutrition management  Role of diet in the treatment of diabetes and the relationship between the three main macronutrients and blood glucose level;Food label reading, portion sizes and measuring food.;Carbohydrate counting;Reviewed blood glucose goals for pre and post meals and how to evaluate the patients' food intake on their blood glucose level.;Information on hints to eating out and maintain blood glucose control.;Meal options for control of blood glucose level and chronic complications.    Physical activity and exercise   Role of exercise on diabetes management, blood pressure control and cardiac health.    Monitoring  Purpose and frequency of SMBG.;Taught/discussed recording of test results and interpretation of SMBG.;Identified appropriate SMBG and/or A1C goals.;Interpreting lab values - A1C, lipid, urine microalbumina.    Acute complications  Taught treatment of hypoglycemia - the 15 rule.;Discussed and identified patients' treatment of hyperglycemia.    Chronic  complications  Lipid levels, blood glucose control and heart disease    Psychosocial adjustment  Role of stress on diabetes;Helped patient identify a support system for diabetes management      Individualized Goals (developed by patient)   Nutrition  Follow meal plan discussed;General guidelines for healthy choices and portions discussed    Physical Activity  Not Applicable    Medications  take my medication as prescribed    Monitoring   Other (comment)    Reducing Risk  examine blood glucose patterns;increase portions of healthy fats;treat hypoglycemia with 15 grams of carbs if blood glucose less than 70mg /dL;increase portions of nuts and seeds;increase portions of olive oil in diet    Health Coping  Not Applicable      Post-Education Assessment   Patient understands the diabetes disease and treatment process.  Demonstrates understanding / competency    Patient understands incorporating nutritional management into lifestyle.  Demonstrates understanding / competency    Patient undertands incorporating physical activity into lifestyle.  Needs Review    Patient understands using medications safely.  Demonstrates understanding / competency    Patient understands monitoring blood glucose, interpreting and using results  Needs Review    Patient understands prevention, detection, and treatment of acute complications.  Demonstrates understanding / competency    Patient understands prevention, detection, and treatment of chronic complications.  Needs Instruction    Patient understands how to develop strategies to address psychosocial issues.  Demonstrates understanding / competency    Patient understands how to develop strategies to promote health/change behavior.  Needs Instruction      Outcomes   Program Status  Not Completed       Learning Objective:  Patient will have a greater understanding of diabetes self-management. Patient education plan is to attend individual and/or group sessions per  assessed needs and concerns.   Plan:   Patient Instructions  - Add protein to breakfast option.   - Add in lunch option between breakfast and dinner. See page 15 in book as guide to include protein, vegetables, and carbohydrates.       Expected Outcomes:  Demonstrated interest in learning. Expect positive outcomes  Education material provided: ADA Diabetes: Your Take Control Guide, Support group flyer and Carbohydrate counting sheet  If problems or questions, patient to contact team via:  Phone and Email  Future DSME appointment: - 2 wks

## 2017-12-22 ENCOUNTER — Ambulatory Visit: Payer: 59 | Admitting: Registered"

## 2018-05-10 ENCOUNTER — Ambulatory Visit (INDEPENDENT_AMBULATORY_CARE_PROVIDER_SITE_OTHER): Payer: 59 | Admitting: Neurology

## 2018-05-10 ENCOUNTER — Encounter: Payer: Self-pay | Admitting: Neurology

## 2018-05-10 ENCOUNTER — Other Ambulatory Visit: Payer: Self-pay

## 2018-05-10 DIAGNOSIS — R269 Unspecified abnormalities of gait and mobility: Secondary | ICD-10-CM | POA: Diagnosis not present

## 2018-05-10 DIAGNOSIS — R3915 Urgency of urination: Secondary | ICD-10-CM | POA: Diagnosis not present

## 2018-05-10 DIAGNOSIS — R202 Paresthesia of skin: Secondary | ICD-10-CM | POA: Diagnosis not present

## 2018-05-10 NOTE — Progress Notes (Signed)
PATIENT: Kimberly [name] DOB: 04/20/1980   Virtual Visit via Video  I connected with Kimberly [name] on 05/10/18 at  by video and verified that I am speaking with the correct [name] using two identifiers.   I discussed the limitations, risks, security and privacy concerns of performing an evaluation and management service by video and the availability of in [name] appointments. I also discussed with the patient that there may be a patient responsible charge related to this service. The patient expressed understanding and agreed to proceed.   History of Present Illness: Kimberly [name] is a 38 year old female, referred by her primary care PA Kimberly [name] for evaluation of difficulty walking,  She had past medical history of diabetes since October 2019, taking Metformin 500 mg twice a day,  She was noted to have gradual onset of difficulty walking since 2018, gradually getting worse, become more obvious since October 2019, she described difficulty climbing up stairs, she has to hold onto handrails, 1 step at a time, complains of bilateral lower extremity feeling tired, heavy, she denies significant upper extremity paresthesia or weakness, able to raise arm overhead without difficulty, she denies significant neck pain,  She denies droopy eyelid, ptosis, swallowing difficulties, no visual loss, no hearing loss, complains of bilateral feet paresthesia, also noticed recent onset urinary and bowel urgency, occasionally accident  MRI of the brain with without contrast at [facility] on April 30, 2018 was normal.  Observations/Objective: I have reviewed problem lists, medications, allergies.  Assessment and Plan: Gradual onset gait abnormality since 2018, progressive worsening,  Normal MRI of the brain with without contrast in March 2020  Minimum sensory complaints, mild bilateral feet paresthesia, also complains of bowel and bladder urgency, occasional incontinence  Potential localization are  cervical, thoracic spinal cord,  Differentiation diagnosis also include myopathy, inflammatory, infectious etiology  Laboratory evaluation first, if there is no significant abnormality found by laboratory evaluation, may consider MRI imaging study of the cervical and thoracic spine  Follow Up Instructions:   I will call her laboratory evaluations,   I discussed the assessment and treatment plan with the patient. The patient was provided an opportunity to ask questions and all were answered. The patient agreed with the plan and demonstrated an understanding of the instructions.   The patient was advised to call back or seek an in-[name] evaluation if the symptoms worsen or if the condition fails to improve as anticipated.  I provided 30 minutes of non-face-to-face time during this encounter.   Levert Feinstein, MD  REVIEW OF SYSTEMS: Full 14 system review of systems performed and notable only for as above All other review of systems were negative.  ALLERGIES: No Known Allergies  HOME MEDICATIONS: Current Outpatient Medications  Medication Sig Dispense Refill  . acetaminophen (TYLENOL) 500 MG tablet Take 1 tablet (500 mg total) by mouth every 6 (six) hours as needed. 30 tablet 0  . doxycycline (DORYX) 100 MG EC tablet Take 1 tablet (100 mg total) by mouth 2 (two) times daily. 20 tablet 0  . HYDROcodone-acetaminophen (NORCO/VICODIN) 5-325 MG tablet Take 1 tablet by mouth every 4 (four) hours as needed. 20 tablet 0  . ibuprofen (ADVIL,MOTRIN) 600 MG tablet Take 1 tablet (600 mg total) by mouth every 6 (six) hours as needed. 30 tablet 0  . metFORMIN (GLUCOPHAGE) 500 MG tablet Take by mouth 2 (two) times daily with a meal.    . mupirocin ointment (BACTROBAN) 2 % Place 1 application into the nose 2 (two) times daily.  22 g 0   No current facility-administered medications for this visit.     PAST MEDICAL HISTORY: Past Medical History:  Diagnosis Date  . Diabetes mellitus without complication  (HCC)   . Ectopic pregnancy     PAST SURGICAL HISTORY: Past Surgical History:  Procedure Laterality Date  . TUBAL LIGATION Left    emergency    FAMILY HISTORY: Family History  Problem Relation Age of Onset  . Diabetes Other   . Stroke Other     SOCIAL HISTORY: Social History   Socioeconomic History  . Marital status: Married    Spouse name: Not on file  . Number of children: Not on file  . Years of education: Not on file  . Highest education level: Not on file  Occupational History  . Not on file  Social Needs  . Financial resource strain: Not on file  . Food insecurity:    Worry: Not on file    Inability: Not on file  . Transportation needs:    Medical: Not on file    Non-medical: Not on file  Tobacco Use  . Smoking status: Current Some Day Smoker    Types: Cigarettes  . Smokeless tobacco: Never Used  Substance and Sexual Activity  . Alcohol use: Yes  . Drug use: No  . Sexual activity: Yes    Birth control/protection: Condom  Lifestyle  . Physical activity:    Days per week: Not on file    Minutes per session: Not on file  . Stress: Not on file  Relationships  . Social connections:    Talks on phone: Not on file    Gets together: Not on file    Attends religious service: Not on file    Active member of club or organization: Not on file    Attends meetings of clubs or organizations: Not on file    Relationship status: Not on file  . Intimate partner violence:    Fear of current or ex partner: Not on file    Emotionally abused: Not on file    Physically abused: Not on file    Forced sexual activity: Not on file  Other Topics Concern  . Not on file  Social History Narrative  . Not on file

## 2018-05-23 ENCOUNTER — Ambulatory Visit: Payer: 59 | Admitting: Neurology

## 2018-06-18 ENCOUNTER — Telehealth: Payer: Self-pay | Admitting: Neurology

## 2018-06-18 NOTE — Telephone Encounter (Signed)
Trying to get patient in this week with Dr. Terrace Arabia.  Levert Feinstein, MD  [facility]        With Terrace Arabia 4 weeks

## 2018-06-19 NOTE — Telephone Encounter (Signed)
Called and scheduled patient a message asking her to call me back about follow up visit for this week with Dr. Terrace Arabia . Left her my direct number.

## 2018-12-14 ENCOUNTER — Other Ambulatory Visit: Payer: Self-pay

## 2018-12-14 DIAGNOSIS — Z20822 Contact with and (suspected) exposure to covid-19: Secondary | ICD-10-CM

## 2018-12-15 LAB — NOVEL CORONAVIRUS, NAA: SARS-CoV-2, NAA: NOT DETECTED

## 2018-12-25 ENCOUNTER — Other Ambulatory Visit: Payer: Self-pay

## 2018-12-25 DIAGNOSIS — Z20822 Contact with and (suspected) exposure to covid-19: Secondary | ICD-10-CM

## 2018-12-26 ENCOUNTER — Encounter: Payer: Self-pay | Admitting: General Surgery

## 2018-12-26 ENCOUNTER — Ambulatory Visit: Payer: 59 | Admitting: Gastroenterology

## 2018-12-26 ENCOUNTER — Telehealth: Payer: Self-pay | Admitting: General Surgery

## 2018-12-26 NOTE — Telephone Encounter (Signed)
No show

## 2018-12-27 LAB — NOVEL CORONAVIRUS, NAA: SARS-CoV-2, NAA: NOT DETECTED

## 2020-10-13 ENCOUNTER — Encounter: Payer: Self-pay | Admitting: Internal Medicine

## 2020-10-21 ENCOUNTER — Encounter (INDEPENDENT_AMBULATORY_CARE_PROVIDER_SITE_OTHER): Payer: 59 | Admitting: Ophthalmology

## 2020-10-22 ENCOUNTER — Other Ambulatory Visit: Payer: Self-pay

## 2020-10-22 ENCOUNTER — Encounter (INDEPENDENT_AMBULATORY_CARE_PROVIDER_SITE_OTHER): Payer: 59 | Admitting: Ophthalmology

## 2020-10-22 DIAGNOSIS — H35033 Hypertensive retinopathy, bilateral: Secondary | ICD-10-CM | POA: Diagnosis not present

## 2020-10-22 DIAGNOSIS — I10 Essential (primary) hypertension: Secondary | ICD-10-CM | POA: Diagnosis not present

## 2020-10-22 DIAGNOSIS — E113523 Type 2 diabetes mellitus with proliferative diabetic retinopathy with traction retinal detachment involving the macula, bilateral: Secondary | ICD-10-CM | POA: Diagnosis not present

## 2020-10-22 DIAGNOSIS — E113513 Type 2 diabetes mellitus with proliferative diabetic retinopathy with macular edema, bilateral: Secondary | ICD-10-CM | POA: Diagnosis not present

## 2020-10-22 DIAGNOSIS — H43813 Vitreous degeneration, bilateral: Secondary | ICD-10-CM

## 2020-10-30 ENCOUNTER — Other Ambulatory Visit: Payer: Self-pay

## 2020-10-30 ENCOUNTER — Encounter (INDEPENDENT_AMBULATORY_CARE_PROVIDER_SITE_OTHER): Payer: 59 | Admitting: Ophthalmology

## 2020-10-30 DIAGNOSIS — E113513 Type 2 diabetes mellitus with proliferative diabetic retinopathy with macular edema, bilateral: Secondary | ICD-10-CM

## 2020-11-05 ENCOUNTER — Ambulatory Visit: Payer: 59 | Admitting: Internal Medicine

## 2020-11-06 ENCOUNTER — Encounter: Payer: Self-pay | Admitting: Internal Medicine

## 2020-11-09 ENCOUNTER — Other Ambulatory Visit: Payer: 59

## 2020-11-09 ENCOUNTER — Encounter: Payer: Self-pay | Admitting: Internal Medicine

## 2020-11-09 ENCOUNTER — Ambulatory Visit (INDEPENDENT_AMBULATORY_CARE_PROVIDER_SITE_OTHER): Payer: 59 | Admitting: Internal Medicine

## 2020-11-09 VITALS — BP 140/80 | HR 84 | Ht 66.0 in | Wt 174.8 lb

## 2020-11-09 DIAGNOSIS — D509 Iron deficiency anemia, unspecified: Secondary | ICD-10-CM

## 2020-11-09 DIAGNOSIS — R197 Diarrhea, unspecified: Secondary | ICD-10-CM | POA: Diagnosis not present

## 2020-11-09 DIAGNOSIS — R109 Unspecified abdominal pain: Secondary | ICD-10-CM | POA: Diagnosis not present

## 2020-11-09 NOTE — Patient Instructions (Addendum)
Your provider has requested that you go to the basement level for lab work before leaving today. Press "B" on the elevator. The lab is located at the first door on the left as you exit the elevator.   You will take only 1/2 the dose of your Evaristo Bury the night before and none the morning of if you take a morning dose   You have been scheduled for an endoscopy and colonoscopy. Please follow the written instructions given to you at your visit today. Please pick up your prep supplies at the pharmacy within the next 1-3 days. If you use inhalers (even only as needed), please bring them with you on the day of your procedure.   Due to recent changes in healthcare laws, you may see the results of your imaging and laboratory studies on MyChart before your provider has had a chance to review them.  We understand that in some cases there may be results that are confusing or concerning to you. Not all laboratory results come back in the same time frame and the provider may be waiting for multiple results in order to interpret others.  Please give [place] 48 hours in order for your provider to thoroughly review all the results before contacting the office for clarification of your results.    If you are age 5 or older, your body mass index should be between 23-30. Your Body mass index is 28.21 kg/m. If this is out of the aforementioned range listed, please consider follow up with your Primary Care Provider.  If you are age 71 or younger, your body mass index should be between 19-25. Your Body mass index is 28.21 kg/m. If this is out of the aformentioned range listed, please consider follow up with your Primary Care Provider.   __________________________________________________________  The [facility] providers would like to encourage you to use [facility] to communicate with providers for non-urgent requests or questions.  Due to long hold times on the telephone, sending your provider a message by [facility] may be a  faster and more efficient way to get a response.  Please allow 48 business hours for a response.  Please remember that this is for non-urgent requests.    I appreciate the  opportunity to care for you  Thank You   Nicole Kindred Dorsey,MD

## 2020-11-09 NOTE — Progress Notes (Signed)
Chief Complaint: Diarrhea  HPI : 40 year old female with history of DM and HTN presents with diarrhea  Patient has had diarrhea since 2019. She states that her diarrhea may have started after she potentially had COVID (was not formally diagnosed with COVID, but patient is suspicious that she had the infection). This diarrhea has been stable over this period of time. Has about 6-8 BMs per day. Endorses 5 nocturnal stools per evening that wake her up from sleep. Endorses fecal urgency. Also has some abdominal and rectal discomfort when she is passing a stool. Denies hematochezia or melena. Stool has been darker with the iron. She has been on iron supplements for the last 3 months. The iron does seem to help with her stool frequency. She used to be on metformin but was taken off of this as a result of her diarrhea. She is now on Rybelsus to help with her blood sugars. Has avoided some foods and beverages that cause her diarrhea (for instance she cut out [employer] sweet teas). Her diarrhea gets worse when her blood sugars are not as well controlled. She does not really have many diarrheal issues if her blood sugars are well controlled. Has occasional episodes of N&V, particularly associated with her menstrual periods. Has upper ab pain underneath her ribs, which worsens when she bends over. Denies fam hx of colon cancer. Denies prior colonoscopy. Not sure if she is having heavy periods.  Wt Readings from Last 3 Encounters:  11/09/20 174 lb 12.8 oz (79.3 kg)  10/06/17 140 lb (63.5 kg)  07/17/14 155 lb (70.3 kg)   Past Medical History:  Diagnosis Date   Diabetes mellitus without complication (HCC)    Ectopic pregnancy    Hypertension      Past Surgical History:  Procedure Laterality Date   TUBAL LIGATION Left    emergency   Family History  Problem Relation Age of Onset   Diabetes Mother    Diabetes Father    Diabetes Other    Stroke Other    Colon cancer Neg Hx    Pancreatic cancer Neg  Hx    Liver disease Neg Hx    Stomach cancer Neg Hx    Esophageal cancer Neg Hx    Social History   Tobacco Use   Smoking status: Former    Types: Cigarettes   Smokeless tobacco: Never  Substance Use Topics   Alcohol use: Yes    Comment: occasional   Drug use: No   Current Outpatient Medications  Medication Sig Dispense Refill   Continuous Blood Gluc Receiver (FREESTYLE LIBRE 2 READER) DEVI See admin instructions.     insulin degludec (TRESIBA FLEXTOUCH) 200 UNIT/ML FlexTouch Pen Tresiba FlexTouch U-200 insulin 200 unit/mL (3 mL) subcutaneous pen     Iron-FA-B Cmp-C-Biot-Probiotic (FUSION PLUS) CAPS Take 1 capsule by mouth daily.     rosuvastatin (CRESTOR) 10 MG tablet rosuvastatin 10 mg tablet  TAKE 1 TABLET BY MOUTH EVERY DAY     RYBELSUS 7 MG TABS Take 1 tablet by mouth daily.     No current facility-administered medications for this visit.   No Known Allergies   Review of Systems: All systems reviewed and negative except where noted in HPI.   Physical Exam: BP 140/80   Pulse 84   Ht 5\' 6"  (1.676 m)   Wt 174 lb 12.8 oz (79.3 kg)   BMI 28.21 kg/m  Constitutional: Pleasant,well-developed, female in no acute distress. HEENT: Normocephalic and atraumatic. Conjunctivae are normal.  No scleral icterus. Cardiovascular: Normal rate, regular rhythm.  Pulmonary/chest: Effort normal and breath sounds normal. No wheezing, rales or rhonchi. Abdominal: Soft, nondistended, has some discomfort near her ribs. Bowel sounds active throughout. There are no masses palpable. No hepatomegaly. Extremities: No edema Neurological: Alert and oriented to person place and time. Skin: Skin is warm and dry. No rashes noted. Psychiatric: Normal mood and affect. Behavior is normal.  Labs 06/2020: HbA1C 12.2%, TSH nml, CBC with Hb 10.9, CMP with glucose 287, Na 132  Labs 10/2020: Hb 9.5, ferritin 37, iron 114  ASSESSMENT AND PLAN: Diarrhea IDA Abdominal discomfort Presents with longstanding  diarrhea for the last few years. Has nocturnal stools. Suspect at this time that her diarrhea may be related to uncontrolled blood sugars as her last HbA1C was 12.2%, and the patient herself notes that her high blood sugars seem to be associated with diarrhea. Will also rule out a few other causes such as an infection. Her upper abdominal discomfort sounds more MSK in nature (worsens with bending down), though will keep track of her symptoms to see if they worsen over time. From review of the patient's recent blood counts, she has developed worsening IDA. Will plan to perform EGD and colonoscopy for further evaluation, and refer to hematology for consideration of IV iron therapy. - Check O&P, GI pathogen panel, fecal elastase - Recommend improved blood sugar control as this could be contributing to her issues with diarrhea. Will let PCP know about this. Could consider referral to endocrinology. - EGD/colonoscopy for IDA. Will plan to obtain gastric, duodenal, and colon biopsies at that time. - Referral to hematology - RTC 2-3 months  Eulah Pont, MD

## 2020-11-10 ENCOUNTER — Telehealth: Payer: Self-pay | Admitting: *Deleted

## 2020-11-10 NOTE — Telephone Encounter (Signed)
Per referral - Dr. Leonides Schanz - called and was unable to lvm - mailed welcome packet with calendar

## 2020-11-16 ENCOUNTER — Telehealth: Payer: Self-pay | Admitting: Internal Medicine

## 2020-11-16 NOTE — Telephone Encounter (Signed)
Pt called asking that her office notes and treatment plan from her 10/3 visit be faxed to [facility].  Her ID# with them is [id number] and their fax number is [phone].  Thank you.

## 2020-11-16 NOTE — Telephone Encounter (Signed)
Returned pt call to advise according to HIPAA laws, a Medical Records request will need to be completed by the pt and returned to our office in order to properly process her request.

## 2020-11-18 ENCOUNTER — Other Ambulatory Visit: Payer: 59

## 2020-11-18 DIAGNOSIS — R197 Diarrhea, unspecified: Secondary | ICD-10-CM

## 2020-11-22 LAB — GI PROFILE, STOOL, PCR

## 2020-11-24 LAB — OVA AND PARASITE EXAMINATION
CONCENTRATE RESULT:: NONE SEEN
MICRO NUMBER:: 12493518
SPECIMEN QUALITY:: ADEQUATE
TRICHROME RESULT:: NONE SEEN

## 2020-11-24 LAB — PANCREATIC ELASTASE, FECAL: Pancreatic Elastase-1, Stool: >500 mcg/g

## 2020-11-27 ENCOUNTER — Encounter (INDEPENDENT_AMBULATORY_CARE_PROVIDER_SITE_OTHER): Payer: 59 | Admitting: Ophthalmology

## 2020-12-01 ENCOUNTER — Ambulatory Visit (AMBULATORY_SURGERY_CENTER): Payer: 59 | Admitting: Internal Medicine

## 2020-12-01 ENCOUNTER — Other Ambulatory Visit: Payer: Self-pay

## 2020-12-01 ENCOUNTER — Encounter: Payer: Self-pay | Admitting: Internal Medicine

## 2020-12-01 ENCOUNTER — Other Ambulatory Visit: Payer: Self-pay | Admitting: Family

## 2020-12-01 VITALS — BP 141/88 | HR 84 | Temp 97.8°F | Resp 16 | Ht 66.0 in | Wt 174.0 lb

## 2020-12-01 DIAGNOSIS — D649 Anemia, unspecified: Secondary | ICD-10-CM

## 2020-12-01 DIAGNOSIS — B9681 Helicobacter pylori [H. pylori] as the cause of diseases classified elsewhere: Secondary | ICD-10-CM | POA: Diagnosis not present

## 2020-12-01 DIAGNOSIS — K259 Gastric ulcer, unspecified as acute or chronic, without hemorrhage or perforation: Secondary | ICD-10-CM

## 2020-12-01 DIAGNOSIS — K639 Disease of intestine, unspecified: Secondary | ICD-10-CM

## 2020-12-01 DIAGNOSIS — K2970 Gastritis, unspecified, without bleeding: Secondary | ICD-10-CM | POA: Diagnosis not present

## 2020-12-01 DIAGNOSIS — D509 Iron deficiency anemia, unspecified: Secondary | ICD-10-CM

## 2020-12-01 DIAGNOSIS — K2950 Unspecified chronic gastritis without bleeding: Secondary | ICD-10-CM | POA: Diagnosis not present

## 2020-12-01 DIAGNOSIS — K5289 Other specified noninfective gastroenteritis and colitis: Secondary | ICD-10-CM

## 2020-12-01 DIAGNOSIS — R197 Diarrhea, unspecified: Secondary | ICD-10-CM

## 2020-12-01 DIAGNOSIS — K649 Unspecified hemorrhoids: Secondary | ICD-10-CM

## 2020-12-01 DIAGNOSIS — K648 Other hemorrhoids: Secondary | ICD-10-CM

## 2020-12-01 DIAGNOSIS — R109 Unspecified abdominal pain: Secondary | ICD-10-CM

## 2020-12-01 MED ORDER — SODIUM CHLORIDE 0.9 % IV SOLN: 500.0000 mL | Freq: Once | INTRAVENOUS | Status: DC

## 2020-12-01 MED ORDER — OMEPRAZOLE 40 MG PO CPDR: 40.0000 mg | DELAYED_RELEASE_CAPSULE | Freq: Two times a day (BID) | ORAL | 0 refills | Status: DC

## 2020-12-01 NOTE — Op Note (Signed)
[facility] Patient Name: Kimberly Young Procedure Date: 12/01/2020 3:23 PM MRN: [id number] Endoscopist: Nicole Kindred "Kimberly Young ,  Age: 40 Referring MD:  Date of Birth: 1980/09/29 Gender: Female Account #: [id number] Procedure:                Upper GI endoscopy Indications:              Iron deficiency anemia Medicines:                Monitored Anesthesia Care Procedure:                Pre-Anesthesia Assessment:                           - Prior to the procedure, a History and Physical                            was performed, and patient medications and                            allergies were reviewed. The patient's tolerance of                            previous anesthesia was also reviewed. The risks                            and benefits of the procedure and the sedation                            options and risks were discussed with the patient.                            All questions were answered, and informed consent                            was obtained. Prior Anticoagulants: The patient has                            taken no previous anticoagulant or antiplatelet                            agents. ASA Grade Assessment: II - A patient with                            mild systemic disease. After reviewing the risks                            and benefits, the patient was deemed in                            satisfactory condition to undergo the procedure.                           After obtaining informed consent, the endoscope was  passed under direct vision. Throughout the                            procedure, the patient's blood pressure, pulse, and                            oxygen saturations were monitored continuously. The                            Endoscope was introduced through the mouth, and                            advanced to the third part of duodenum. The upper                            GI endoscopy was  accomplished without difficulty.                            The patient tolerated the procedure well. Scope In: Scope Out: Findings:                 The examined esophagus was normal. Biopsies were                            taken with a cold forceps for histology.                           Scattered inflammation characterized by erosions                            and erythema was found in the gastric antrum.                            Erosions had signs of recent bleeding. Hematin was                            seen in the stomach. Biopsies were taken with a                            cold forceps for Helicobacter pylori testing.                           The examined duodenum was normal. Complications:            No immediate complications. Estimated Blood Loss:     Estimated blood loss was minimal. Impression:               - Normal esophagus. Biopsied.                           - Gastritis. Recently bled. Biopsied.                           - Normal examined duodenum. Recommendation:           - Use a proton pump inhibitor PO BID for  8 weeks.                           - No aspirin, ibuprofen, naproxen, or other                            non-steroidal anti-inflammatory drugs.                           - Await pathology results.                           - Perform a colonoscopy today. Nicole Kindred "Kimberly Young,  12/01/2020 4:02:36 PM

## 2020-12-01 NOTE — Patient Instructions (Signed)
Discharge instructions given. Handouts on Gastritis and Hemorrhoids. Resume previous medications. Prescription sent to pharmacy by Dr. Leonides Schanz. YOU HAD AN ENDOSCOPIC PROCEDURE TODAY AT THE [facility]:   Refer to the procedure report that was given to you for any specific questions about what was found during the examination.  If the procedure report does not answer your questions, please call your gastroenterologist to clarify.  If you requested that your care partner not be given the details of your procedure findings, then the procedure report has been included in a sealed envelope for you to review at your convenience later.  YOU SHOULD EXPECT: Some feelings of bloating in the abdomen. Passage of more gas than usual.  Walking can help get rid of the air that was put into your GI tract during the procedure and reduce the bloating. If you had a lower endoscopy (such as a colonoscopy or flexible sigmoidoscopy) you may notice spotting of blood in your stool or on the toilet paper. If you underwent a bowel prep for your procedure, you may not have a normal bowel movement for a few days.  Please Note:  You might notice some irritation and congestion in your nose or some drainage.  This is from the oxygen used during your procedure.  There is no need for concern and it should clear up in a day or so.  SYMPTOMS TO REPORT IMMEDIATELY:  Following lower endoscopy (colonoscopy or flexible sigmoidoscopy):  Excessive amounts of blood in the stool  Significant tenderness or worsening of abdominal pains  Swelling of the abdomen that is new, acute  Fever of 100F or higher  Following upper endoscopy (EGD)  Vomiting of blood or coffee ground material  New chest pain or pain under the shoulder blades  Painful or persistently difficult swallowing  New shortness of breath  Fever of 100F or higher  Black, tarry-looking stools  For urgent or emergent issues, a gastroenterologist can be reached at  any hour by calling [phone]. Do not use MyChart messaging for urgent concerns.    DIET:  We do recommend a small meal at first, but then you may proceed to your regular diet.  Drink plenty of fluids but you should avoid alcoholic beverages for 24 hours.  ACTIVITY:  You should plan to take it easy for the rest of today and you should NOT DRIVE or use heavy machinery until tomorrow (because of the sedation medicines used during the test).    FOLLOW UP: Our staff will call the number listed on your records 48-72 hours following your procedure to check on you and address any questions or concerns that you may have regarding the information given to you following your procedure. If we do not reach you, we will leave a message.  We will attempt to reach you two times.  During this call, we will ask if you have developed any symptoms of COVID 19. If you develop any symptoms (ie: fever, flu-like symptoms, shortness of breath, cough etc.) before then, please call [phone].  If you test positive for Covid 19 in the 2 weeks post procedure, please call and report this information to [place].    If any biopsies were taken you will be contacted by phone or by letter within the next 1-3 weeks.  Please call us at [phone] if you have not heard about the biopsies in 3 weeks.    SIGNATURES/CONFIDENTIALITY: You and/or your care partner have signed paperwork which will be entered into your  electronic medical record.  These signatures attest to the fact that that the information above on your After Visit Summary has been reviewed and is understood.  Full responsibility of the confidentiality of this discharge information lies with you and/or your care-partner.

## 2020-12-01 NOTE — Op Note (Signed)
[facility] Patient Name: Kimberly Young Procedure Date: 12/01/2020 3:22 PM MRN: [id number] Endoscopist: Nicole Kindred "Kimberly Young ,  Age: 40 Referring MD:  Date of Birth: 1980-11-15 Gender: Female Account #: [id number] Procedure:                Colonoscopy Indications:              Chronic diarrhea, Iron deficiency anemia Medicines:                Monitored Anesthesia Care Procedure:                Pre-Anesthesia Assessment:                           - Prior to the procedure, a History and Physical                            was performed, and patient medications and                            allergies were reviewed. The patient's tolerance of                            previous anesthesia was also reviewed. The risks                            and benefits of the procedure and the sedation                            options and risks were discussed with the patient.                            All questions were answered, and informed consent                            was obtained. Prior Anticoagulants: The patient has                            taken no previous anticoagulant or antiplatelet                            agents. ASA Grade Assessment: II - A patient with                            mild systemic disease. After reviewing the risks                            and benefits, the patient was deemed in                            satisfactory condition to undergo the procedure.                           After obtaining informed consent, the colonoscope  was passed under direct vision. Throughout the                            procedure, the patient's blood pressure, pulse, and                            oxygen saturations were monitored continuously. The                            Olympus PCF-H190DL (#[id number]) Colonoscope was                            introduced through the anus and advanced to the the                            terminal ileum.  The colonoscopy was performed                            without difficulty. The patient tolerated the                            procedure well. The quality of the bowel                            preparation was good. Scope In: 3:38:12 PM Scope Out: 3:52:20 PM Scope Withdrawal Time: 0 hours 11 minutes 36 seconds  Total Procedure Duration: 0 hours 14 minutes 8 seconds  Findings:                 The terminal ileum appeared normal.                           A scattered area of mildly erythematous mucosa was                            found in the entire colon. This was biopsied with a                            cold forceps for histology.                           Non-bleeding internal hemorrhoids were found during                            retroflexion. Complications:            No immediate complications. Estimated Blood Loss:     Estimated blood loss was minimal. Impression:               - The examined portion of the ileum was normal.                           - Erythematous mucosa in the entire examined colon.                            Biopsied.                           -  Non-bleeding internal hemorrhoids. Recommendation:           - Discharge patient to home (with escort).                           - The erosions in your stomach may be contributing                            to your anemia. No other obvious causes were noted.                           - Await pathology results.                           - Return to GI clinic as previously scheduled.                           - The findings and recommendations were discussed                            with the patient. Nicole Kindred "Kimberly Young,  12/01/2020 4:06:56 PM

## 2020-12-01 NOTE — Progress Notes (Signed)
Report given to PACU, vss 

## 2020-12-01 NOTE — Progress Notes (Signed)
GASTROENTEROLOGY PROCEDURE H&P NOTE   Primary Care Physician: Marva Panda, NP    Reason for Procedure:   IDA, diarrhea  Plan:    EGD/colonoscopy  Patient is appropriate for endoscopic procedure(s) in the ambulatory ([facility]) setting.  The nature of the procedure, as well as the risks, benefits, and alternatives were carefully and thoroughly reviewed with the patient. Ample time for discussion and questions allowed. The patient understood, was satisfied, and agreed to proceed.     HPI: Kimberly Young is a 40 y.o. female who presents for EGD/colonoscopy for evaluation of IDA, diarrhea .  Patient was most recently seen in the Gastroenterology Clinic on 11/09/20.  No interval change in medical history since that appointment. Please refer to that note for full details regarding GI history and clinical presentation.   Past Medical History:  Diagnosis Date   Allergy    Diabetes mellitus without complication (HCC)    Ectopic pregnancy    Hypertension     Past Surgical History:  Procedure Laterality Date   TUBAL LIGATION Left    emergency    Prior to Admission medications   Medication Sig Start Date End Date Taking? Authorizing Provider  Continuous Blood Gluc Receiver (FREESTYLE LIBRE 2 READER) DEVI See admin instructions. 06/29/20  Yes [provider]  insulin degludec (TRESIBA FLEXTOUCH) 200 UNIT/ML FlexTouch Pen Tresiba FlexTouch U-200 insulin 200 unit/mL (3 mL) subcutaneous pen   Yes [provider]  Iron-FA-B Cmp-C-Biot-Probiotic (FUSION PLUS) CAPS Take 1 capsule by mouth daily. 09/24/20  Yes [provider]  rosuvastatin (CRESTOR) 10 MG tablet rosuvastatin 10 mg tablet  TAKE 1 TABLET BY MOUTH EVERY DAY   Yes [provider]  RYBELSUS 7 MG TABS Take 1 tablet by mouth daily. 09/25/20  Yes [provider]    Current Outpatient Medications  Medication Sig Dispense Refill   Continuous Blood Gluc Receiver (FREESTYLE LIBRE 2  READER) DEVI See admin instructions.     insulin degludec (TRESIBA FLEXTOUCH) 200 UNIT/ML FlexTouch Pen Tresiba FlexTouch U-200 insulin 200 unit/mL (3 mL) subcutaneous pen     Iron-FA-B Cmp-C-Biot-Probiotic (FUSION PLUS) CAPS Take 1 capsule by mouth daily.     rosuvastatin (CRESTOR) 10 MG tablet rosuvastatin 10 mg tablet  TAKE 1 TABLET BY MOUTH EVERY DAY     RYBELSUS 7 MG TABS Take 1 tablet by mouth daily.     Current Facility-Administered Medications  Medication Dose Route Frequency Provider Last Rate Last Admin   0.9 %  sodium chloride infusion  500 mL Intravenous Once Imogene Burn, MD        Allergies as of 12/01/2020 - Review Complete 12/01/2020  Allergen Reaction Noted   Seasonal ic [cholestatin]  12/01/2020    Family History  Problem Relation Age of Onset   Diabetes Mother    Diabetes Father    Diabetes Other    Stroke Other    Colon cancer Neg Hx    Pancreatic cancer Neg Hx    Liver disease Neg Hx    Stomach cancer Neg Hx    Esophageal cancer Neg Hx    Rectal cancer Neg Hx     Social History   Socioeconomic History   Marital status: Married    Spouse name: Not on file   Number of children: Not on file   Years of education: Not on file   Highest education level: Not on file  Occupational History   Not on file  Tobacco Use   Smoking status: Former  Types: Cigarettes   Smokeless tobacco: Never  Substance and Sexual Activity   Alcohol use: Yes    Comment: occasional   Drug use: No   Sexual activity: Yes    Birth control/protection: Condom  Other Topics Concern   Not on file  Social History Narrative   Not on file   Social Determinants of Health   Financial Resource Strain: Not on file  Food Insecurity: Not on file  Transportation Needs: Not on file  Physical Activity: Not on file  Stress: Not on file  Social Connections: Not on file  Intimate Partner Violence: Not on file    Physical Exam: Vital signs in last 24 hours: BP (!) 148/80   Pulse  88   Temp 97.8 F (36.6 C)   Ht 5\' 6"  (1.676 m)   Wt 174 lb (78.9 kg)   LMP 11/09/2020 (Approximate)   SpO2 98%   BMI 28.08 kg/m  GEN: NAD EYE: Sclerae anicteric ENT: MMM CV: Non-tachycardic Pulm: No increased WOB GI: Soft NEURO:  Alert & Oriented   01/09/2021, MD [facility]   12/01/2020 3:14 PM

## 2020-12-01 NOTE — Progress Notes (Signed)
1524 Robinul 0.1 mg IV given due large amount of secretions upon assessment.  MD made aware, vss  

## 2020-12-01 NOTE — Progress Notes (Signed)
Called to room to assist during endoscopic procedure.  Patient ID and intended procedure confirmed with present staff. Received instructions for my participation in the procedure from the performing physician.  

## 2020-12-02 ENCOUNTER — Telehealth: Payer: Self-pay | Admitting: *Deleted

## 2020-12-02 ENCOUNTER — Inpatient Hospital Stay: Payer: 59 | Attending: Hematology & Oncology

## 2020-12-02 ENCOUNTER — Inpatient Hospital Stay (HOSPITAL_BASED_OUTPATIENT_CLINIC_OR_DEPARTMENT_OTHER): Payer: 59 | Admitting: Family

## 2020-12-02 ENCOUNTER — Encounter: Payer: Self-pay | Admitting: Family

## 2020-12-02 VITALS — BP 142/92 | HR 92 | Temp 99.1°F | Resp 16 | Wt 175.5 lb

## 2020-12-02 DIAGNOSIS — E119 Type 2 diabetes mellitus without complications: Secondary | ICD-10-CM | POA: Insufficient documentation

## 2020-12-02 DIAGNOSIS — Z794 Long term (current) use of insulin: Secondary | ICD-10-CM | POA: Diagnosis not present

## 2020-12-02 DIAGNOSIS — D649 Anemia, unspecified: Secondary | ICD-10-CM

## 2020-12-02 DIAGNOSIS — D500 Iron deficiency anemia secondary to blood loss (chronic): Secondary | ICD-10-CM

## 2020-12-02 DIAGNOSIS — I10 Essential (primary) hypertension: Secondary | ICD-10-CM | POA: Insufficient documentation

## 2020-12-02 DIAGNOSIS — Z87891 Personal history of nicotine dependence: Secondary | ICD-10-CM | POA: Diagnosis not present

## 2020-12-02 DIAGNOSIS — D509 Iron deficiency anemia, unspecified: Secondary | ICD-10-CM | POA: Insufficient documentation

## 2020-12-02 DIAGNOSIS — Z79899 Other long term (current) drug therapy: Secondary | ICD-10-CM | POA: Diagnosis not present

## 2020-12-02 LAB — CMP (CANCER CENTER ONLY)
ALT: 18 U/L (ref 0–44)
AST: 18 U/L (ref 15–41)
Albumin: 3.3 g/dL — ABNORMAL LOW (ref 3.5–5.0)
Alkaline Phosphatase: 54 U/L (ref 38–126)
Anion gap: 6 (ref 5–15)
BUN: 18 mg/dL (ref 6–20)
CO2: 21 mmol/L — ABNORMAL LOW (ref 22–32)
Calcium: 8.2 mg/dL — ABNORMAL LOW (ref 8.9–10.3)
Chloride: 108 mmol/L (ref 98–111)
Creatinine: 0.91 mg/dL (ref 0.44–1.00)
GFR, Estimated: >60 mL/min (ref >60)
Glucose, Bld: 205 mg/dL — ABNORMAL HIGH (ref 70–99)
Potassium: 4.4 mmol/L (ref 3.5–5.1)
Sodium: 135 mmol/L (ref 135–145)
Total Bilirubin: 0.2 mg/dL — ABNORMAL LOW (ref 0.3–1.2)
Total Protein: 6.1 g/dL — ABNORMAL LOW (ref 6.5–8.1)

## 2020-12-02 LAB — IRON AND TIBC
Iron: 26 ug/dL — ABNORMAL LOW (ref 41–142)
Saturation Ratios: 9 % — ABNORMAL LOW (ref 21–57)
TIBC: 283 ug/dL (ref 236–444)
UIBC: 256 ug/dL (ref 120–384)

## 2020-12-02 LAB — RETICULOCYTES
Immature Retic Fract: 6.2 % (ref 2.3–15.9)
RBC.: 2.76 MIL/uL — ABNORMAL LOW (ref 3.87–5.11)
Retic Count, Absolute: 58.8 10*3/uL (ref 19.0–186.0)
Retic Ct Pct: 2.1 % (ref 0.4–3.1)

## 2020-12-02 LAB — CBC WITH DIFFERENTIAL (CANCER CENTER ONLY)
Abs Immature Granulocytes: 0.02 10*3/uL (ref 0.00–0.07)
Basophils Absolute: 0 10*3/uL (ref 0.0–0.1)
Basophils Relative: 0 %
Eosinophils Absolute: 0.2 10*3/uL (ref 0.0–0.5)
Eosinophils Relative: 2 %
HCT: 25 % — ABNORMAL LOW (ref 36.0–46.0)
Hemoglobin: 8.8 g/dL — ABNORMAL LOW (ref 12.0–15.0)
Immature Granulocytes: 0 %
Lymphocytes Relative: 28 %
Lymphs Abs: 2.4 10*3/uL (ref 0.7–4.0)
MCH: 32.2 pg (ref 26.0–34.0)
MCHC: 35.2 g/dL (ref 30.0–36.0)
MCV: 91.6 fL (ref 80.0–100.0)
Monocytes Absolute: 0.5 10*3/uL (ref 0.1–1.0)
Monocytes Relative: 5 %
Neutro Abs: 5.4 10*3/uL (ref 1.7–7.7)
Neutrophils Relative %: 65 %
Platelet Count: 273 10*3/uL (ref 150–400)
RBC: 2.73 MIL/uL — ABNORMAL LOW (ref 3.87–5.11)
RDW: 11.9 % (ref 11.5–15.5)
WBC Count: 8.5 10*3/uL (ref 4.0–10.5)
nRBC: 0 % (ref 0.0–0.2)

## 2020-12-02 LAB — FERRITIN: Ferritin: 39 ng/mL (ref 11–307)

## 2020-12-02 LAB — SAVE SMEAR(SSMR), FOR PROVIDER SLIDE REVIEW

## 2020-12-02 LAB — LACTATE DEHYDROGENASE: LDH: 150 U/L (ref 98–192)

## 2020-12-02 NOTE — Telephone Encounter (Signed)
Per 12/02/20 los - called and gave upcoming appointments - requested call back to confirm - mailed calendar

## 2020-12-02 NOTE — Progress Notes (Signed)
Hematology/Oncology Consultation   Name: Kimberly Young      MRN: [id number]    Location: Room/bed info not found  Date: 12/02/2020 Time:10:15 AM   REFERRING PHYSICIAN: Norwood Levo, MD   REASON FOR CONSULT: Iron deficiency anemia    DIAGNOSIS: Iron deficiency anemia  HISTORY OF PRESENT ILLNESS: [name] is a very pleasant 40 yo African American female with history of iron deficiency anemia. She has been taking Fusion plus PO daily for the last 3 months.  She states that her cycle is heavy the first 2-3 days. She has also had some irregular spotting between cycles.  She has 2 children and history of 1 ectopic pregnancy. She states that her procedure to remove the fallopian tube went well without any complications.  She has never received a blood transfusion or IV iron.  She had a colonoscopy and EGD yesterday. EGD showed gastritis with recent bleed (gastric antrum) and colonoscopy showed erythematous mucosa throughout the entire colon and non bleeding internal hemorrhoids. She is starting Prilosec daily. No familial history of anemia. No known sickle cell disease or trait.  No personal or familial history of cancer.  She is diabetic and on insulin.  She states that she had some bleeding behind the right eye due to her diabetes and goes for Byzantine injection once a month.  No other known blood loss. No abnormal bruising, no petechiae.  No history of thyroid disease.  No fever, chills, n/v, cough, rash, dizziness, SOB, chest pain, palpitations, abdominal pain or changes in bowel or bladder habits.  No swelling, tenderness, numbness or tingling in her extremities at this time.  She has positional tingling in her fingertips in the morning. This resolves once she moves around.  No falls or syncope to report.  No smoking or recreational drug use. Rare ETOH socially.  She has a good appetite and is staying well hydrated. Her weight is stable at 175 lbs.  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    ROS: All other 10 point review of systems is negative.   PAST MEDICAL HISTORY:   Past Medical History:  Diagnosis Date   Allergy    Diabetes mellitus without complication (HCC)    Ectopic pregnancy    Hypertension     ALLERGIES: Allergies  Allergen Reactions   Seasonal Ic [Cholestatin]       MEDICATIONS:  Current Outpatient Medications on File Prior to Visit  Medication Sig Dispense Refill   Continuous Blood Gluc Receiver (FREESTYLE LIBRE 2 READER) DEVI See admin instructions.     insulin degludec (TRESIBA FLEXTOUCH) 200 UNIT/ML FlexTouch Pen Tresiba FlexTouch U-200 insulin 200 unit/mL (3 mL) subcutaneous pen     Iron-FA-B Cmp-C-Biot-Probiotic (FUSION PLUS) CAPS Take 1 capsule by mouth daily.     omeprazole (PRILOSEC) 40 MG capsule Take 1 capsule (40 mg total) by mouth in the morning and at bedtime. 120 capsule 0   rosuvastatin (CRESTOR) 10 MG tablet rosuvastatin 10 mg tablet  TAKE 1 TABLET BY MOUTH EVERY DAY     RYBELSUS 7 MG TABS Take 1 tablet by mouth daily.     No current facility-administered medications on file prior to visit.     PAST SURGICAL HISTORY Past Surgical History:  Procedure Laterality Date   TUBAL LIGATION Left    emergency    FAMILY HISTORY: Family History  Problem Relation Age of Onset   Diabetes Mother    Diabetes Father    Diabetes Other  Stroke Other    Colon cancer Neg Hx    Pancreatic cancer Neg Hx    Liver disease Neg Hx    Stomach cancer Neg Hx    Esophageal cancer Neg Hx    Rectal cancer Neg Hx     SOCIAL HISTORY:  reports that she has quit smoking. Her smoking use included cigarettes. She has never used smokeless tobacco. She reports current alcohol use. She reports that she does not use drugs.  PERFORMANCE STATUS: The patient's performance status is 1 - Symptomatic but completely ambulatory  PHYSICAL EXAM: Most Recent Vital Signs: Blood pressure (!) 142/92, pulse 92, temperature 99.1 F (37.3 C), temperature  source Oral, resp. rate 16, weight 175 lb 8 oz (79.6 kg), last menstrual period 11/09/2020, SpO2 100 %. BP (!) 142/92 (BP Location: Right Arm, Patient Position: Sitting)   Pulse 92   Temp 99.1 F (37.3 C) (Oral)   Resp 16   Wt 175 lb 8 oz (79.6 kg)   LMP 11/09/2020 (Approximate)   SpO2 100%   BMI 28.33 kg/m   General Appearance:    Alert, cooperative, no distress, appears stated age  Head:    Normocephalic, without obvious abnormality, atraumatic  Eyes:    PERRL, conjunctiva/corneas clear, EOM's intact, fundi    benign, both eyes        Throat:   Lips, mucosa, and tongue normal; teeth and gums normal  Neck:   Supple, symmetrical, trachea midline, no adenopathy;    thyroid:  no enlargement/tenderness/nodules; no carotid   bruit or JVD  Back:     Symmetric, no curvature, ROM normal, no CVA tenderness  Lungs:     Clear to auscultation bilaterally, respirations unlabored  Chest Wall:    No tenderness or deformity   Heart:    Regular rate and rhythm, S1 and S2 normal, no murmur, rub   or gallop     Abdomen:     Soft, non-tender, bowel sounds active all four quadrants,    no masses, no organomegaly        Extremities:   Extremities normal, atraumatic, no cyanosis or edema  Pulses:   2+ and symmetric all extremities  Skin:   Skin color, texture, turgor normal, no rashes or lesions  Lymph nodes:   Cervical, supraclavicular, and axillary nodes normal  Neurologic:   CNII-XII intact, normal strength, sensation and reflexes    throughout    LABORATORY DATA:  Results for orders placed or performed in visit on 12/02/20 (from the past 48 hour(s))  Save Smear (SSMR)     Status: None   Collection Time: 12/02/20  8:50 AM  Result Value Ref Range   Smear Review SMEAR STAINED AND AVAILABLE FOR REVIEW     Comment: Performed at [facility] Lab at [facility], [address], [place], [place] [address]  Reticulocytes     Status: Abnormal   Collection Time: 12/02/20   8:50 AM  Result Value Ref Range   Retic Ct Pct 2.1 0.4 - 3.1 %   RBC. 2.76 (L) 3.87 - 5.11 MIL/uL   Retic Count, Absolute 58.8 19.0 - 186.0 K/uL   Immature Retic Fract 6.2 2.3 - 15.9 %    Comment: Performed at [facility] at [facility], [address] [place], [place] [address]  CMP (Cancer Center only)     Status: Abnormal   Collection Time: 12/02/20  8:50 AM  Result Value Ref Range  Sodium 135 135 - 145 mmol/L   Potassium 4.4 3.5 - 5.1 mmol/L   Chloride 108 98 - 111 mmol/L   CO2 21 (L) 22 - 32 mmol/L   Glucose, Bld 205 (H) 70 - 99 mg/dL    Comment: Glucose reference range applies only to samples taken after fasting for at least 8 hours.   BUN 18 6 - 20 mg/dL   Creatinine 4.40 1.02 - 1.00 mg/dL   Calcium 8.2 (L) 8.9 - 10.3 mg/dL   Total Protein 6.1 (L) 6.5 - 8.1 g/dL   Albumin 3.3 (L) 3.5 - 5.0 g/dL   AST 18 15 - 41 U/L   ALT 18 0 - 44 U/L   Alkaline Phosphatase 54 38 - 126 U/L   Total Bilirubin 0.2 (L) 0.3 - 1.2 mg/dL   GFR, Estimated >72 >53 mL/min    Comment: (NOTE) Calculated using the CKD-EPI Creatinine Equation (2021)    Anion gap 6 5 - 15    Comment: Performed at [facility] Lab at [facility], [address] [place], [place] [address]  CBC with Differential (Cancer Center Only)     Status: Abnormal   Collection Time: 12/02/20  8:50 AM  Result Value Ref Range   WBC Count 8.5 4.0 - 10.5 K/uL   RBC 2.73 (L) 3.87 - 5.11 MIL/uL   Hemoglobin 8.8 (L) 12.0 - 15.0 g/dL   HCT 34.7 (L) 42.5 - 95.6 %   MCV 91.6 80.0 - 100.0 fL   MCH 32.2 26.0 - 34.0 pg   MCHC 35.2 30.0 - 36.0 g/dL   RDW 38.7 56.4 - 33.2 %   Platelet Count 273 150 - 400 K/uL   nRBC 0.0 0.0 - 0.2 %   Neutrophils Relative % 65 %   Neutro Abs 5.4 1.7 - 7.7 K/uL   Lymphocytes Relative 28 %   Lymphs Abs 2.4 0.7 - 4.0 K/uL   Monocytes Relative 5 %   Monocytes Absolute 0.5 0.1 - 1.0 K/uL   Eosinophils Relative 2 %   Eosinophils Absolute 0.2 0.0 - 0.5 K/uL    Basophils Relative 0 %   Basophils Absolute 0.0 0.0 - 0.1 K/uL   Immature Granulocytes 0 %   Abs Immature Granulocytes 0.02 0.00 - 0.07 K/uL    Comment: Performed at [facility] Lab at [facility], [address], [place], [place] [address]  Lactate dehydrogenase (LDH)     Status: None   Collection Time: 12/02/20  8:51 AM  Result Value Ref Range   LDH 150 98 - 192 U/L    Comment: Performed at [facility] Lab at [facility], [address], [place], [place] [address]      RADIOGRAPHY: No results found.     PATHOLOGY: None  ASSESSMENT/PLAN: [name] is a very pleasant 40 yo African American female with history of iron deficiency anemia.  Hgb 8.8, MCV 91, platelets 273 and WBC count 8.5.  Anemia work up pending. We will replace as indicated.  No transfusion needed at this time.  Follow-up in 8 weeks.   All questions were answered. The patient knows to call the clinic with any problems, questions or concerns. We can certainly see the patient much sooner if necessary.  Eileen Stanford, NP

## 2020-12-03 ENCOUNTER — Telehealth: Payer: Self-pay

## 2020-12-03 ENCOUNTER — Telehealth: Payer: Self-pay | Admitting: *Deleted

## 2020-12-03 LAB — ERYTHROPOIETIN: Erythropoietin: 10 m[IU]/mL (ref 2.6–18.5)

## 2020-12-03 NOTE — Telephone Encounter (Signed)
Rec'd fax request from [facility] requesting disability paperwork to be completed and records faxed.  Called patient and left detailed message that she will need to come to our office and complete the request form, sign the medical records release and agree to the fee. Provided patient with address and phone number for our office

## 2020-12-03 NOTE — Telephone Encounter (Signed)
Attempted f/u phone call. No answer. Left message. °

## 2020-12-03 NOTE — Telephone Encounter (Signed)
  Follow up Call-  Call back number 12/01/2020  Post procedure Call Back phone  # [phone]  Permission to leave phone message Yes  Some recent data might be hidden     Patient questions:  Do you have a fever, pain , or abdominal swelling? No. Pain Score  0 *  Have you tolerated food without any problems? Yes.    Have you been able to return to your normal activities? Yes.    Do you have any questions about your discharge instructions: Diet   No. Medications  No. Follow up visit  No.  Do you have questions or concerns about your Care? No.  Actions: * If pain score is 4 or above: No action needed, pain <4.   Have you developed a fever since your procedure? no  2.   Have you had an respiratory symptoms (SOB or cough) since your procedure? No   3.   Have you tested positive for COVID 19 since your procedure no   4.   Have you had any family members/close contacts diagnosed with the COVID 19 since your procedure?  No    If yes to any of these questions please route to Laverna Peace, RN and Karlton Lemon, RN

## 2020-12-04 LAB — HGB FRACTIONATION CASCADE
Hgb A2: 2.8 % (ref 1.8–3.2)
Hgb A: 97.2 % (ref 96.4–98.8)
Hgb F: 0 % (ref 0.0–2.0)
Hgb S: 0 %

## 2020-12-06 ENCOUNTER — Encounter: Payer: Self-pay | Admitting: Internal Medicine

## 2020-12-06 NOTE — Progress Notes (Signed)
Hi Ammie, please call the patient and let her know about the information below. Please place the order for the quadruple therapy and stool H pylori antigen. Thanks!  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 Recommend bismuth quadruple therapy for treatment: - Tetracycline 500 mg QID x 14 days - Flagyl 250 mg QID x 14 days - Bismuth subsalicylate 524 mg QID x 14 days - PPI BID x 14 days  About 4 weeks after completion of therapy, patient will need to be checked for eradication with a stool H pylori antigen. PPI therapy will need to be held 2 weeks prior to checking for eradication.

## 2020-12-07 LAB — ALPHA-THALASSEMIA GENOTYPR

## 2020-12-08 ENCOUNTER — Ambulatory Visit: Payer: 59

## 2020-12-08 ENCOUNTER — Other Ambulatory Visit: Payer: Self-pay | Admitting: Family

## 2020-12-08 ENCOUNTER — Telehealth: Payer: Self-pay | Admitting: Family

## 2020-12-08 ENCOUNTER — Encounter: Payer: Self-pay | Admitting: Internal Medicine

## 2020-12-08 ENCOUNTER — Telehealth: Payer: Self-pay | Admitting: *Deleted

## 2020-12-08 DIAGNOSIS — D509 Iron deficiency anemia, unspecified: Secondary | ICD-10-CM

## 2020-12-08 NOTE — Telephone Encounter (Signed)
Per scheduling message called and lvm for call back to schedule for (1) dose of IV Iron (Monoferric)

## 2020-12-09 ENCOUNTER — Inpatient Hospital Stay: Payer: 59 | Attending: Family

## 2020-12-09 ENCOUNTER — Other Ambulatory Visit: Payer: Self-pay

## 2020-12-09 VITALS — BP 165/89 | HR 90 | Temp 98.6°F | Resp 17

## 2020-12-09 DIAGNOSIS — Z79899 Other long term (current) drug therapy: Secondary | ICD-10-CM | POA: Diagnosis not present

## 2020-12-09 DIAGNOSIS — D509 Iron deficiency anemia, unspecified: Secondary | ICD-10-CM | POA: Diagnosis not present

## 2020-12-09 MED ORDER — SODIUM CHLORIDE 0.9 % IV SOLN: Freq: Once | INTRAVENOUS | Status: AC

## 2020-12-09 MED ORDER — SODIUM CHLORIDE 0.9 % IV SOLN
1000.0000 mg | Freq: Once | INTRAVENOUS | Status: AC
  Administered 2020-12-09: 1000 mg via INTRAVENOUS
  Filled 2020-12-09: qty 10

## 2020-12-09 NOTE — Patient Instructions (Signed)

## 2020-12-09 NOTE — Progress Notes (Signed)
Pt without complaints at time of discharge.   

## 2020-12-22 ENCOUNTER — Encounter (INDEPENDENT_AMBULATORY_CARE_PROVIDER_SITE_OTHER): Payer: 59 | Admitting: Ophthalmology

## 2020-12-22 ENCOUNTER — Other Ambulatory Visit: Payer: Self-pay

## 2020-12-22 DIAGNOSIS — I10 Essential (primary) hypertension: Secondary | ICD-10-CM | POA: Diagnosis not present

## 2020-12-22 DIAGNOSIS — H4311 Vitreous hemorrhage, right eye: Secondary | ICD-10-CM

## 2020-12-22 DIAGNOSIS — H35373 Puckering of macula, bilateral: Secondary | ICD-10-CM

## 2020-12-22 DIAGNOSIS — E113513 Type 2 diabetes mellitus with proliferative diabetic retinopathy with macular edema, bilateral: Secondary | ICD-10-CM | POA: Diagnosis not present

## 2020-12-22 DIAGNOSIS — H35033 Hypertensive retinopathy, bilateral: Secondary | ICD-10-CM

## 2020-12-22 DIAGNOSIS — H43813 Vitreous degeneration, bilateral: Secondary | ICD-10-CM

## 2020-12-23 ENCOUNTER — Other Ambulatory Visit: Payer: Self-pay | Admitting: Internal Medicine

## 2021-01-05 ENCOUNTER — Encounter (INDEPENDENT_AMBULATORY_CARE_PROVIDER_SITE_OTHER): Payer: 59 | Admitting: Ophthalmology

## 2021-01-05 ENCOUNTER — Other Ambulatory Visit: Payer: Self-pay

## 2021-01-05 DIAGNOSIS — E113511 Type 2 diabetes mellitus with proliferative diabetic retinopathy with macular edema, right eye: Secondary | ICD-10-CM | POA: Diagnosis not present

## 2021-01-08 ENCOUNTER — Ambulatory Visit: Payer: 59 | Admitting: Internal Medicine

## 2021-01-19 ENCOUNTER — Other Ambulatory Visit: Payer: Self-pay

## 2021-01-19 ENCOUNTER — Encounter (INDEPENDENT_AMBULATORY_CARE_PROVIDER_SITE_OTHER): Payer: 59 | Admitting: Ophthalmology

## 2021-01-19 DIAGNOSIS — H35033 Hypertensive retinopathy, bilateral: Secondary | ICD-10-CM

## 2021-01-19 DIAGNOSIS — H43813 Vitreous degeneration, bilateral: Secondary | ICD-10-CM

## 2021-01-19 DIAGNOSIS — E113513 Type 2 diabetes mellitus with proliferative diabetic retinopathy with macular edema, bilateral: Secondary | ICD-10-CM

## 2021-01-19 DIAGNOSIS — H4311 Vitreous hemorrhage, right eye: Secondary | ICD-10-CM

## 2021-01-19 DIAGNOSIS — I10 Essential (primary) hypertension: Secondary | ICD-10-CM

## 2021-01-20 ENCOUNTER — Encounter: Payer: Self-pay | Admitting: Internal Medicine

## 2021-01-20 ENCOUNTER — Ambulatory Visit (INDEPENDENT_AMBULATORY_CARE_PROVIDER_SITE_OTHER): Payer: 59 | Admitting: Internal Medicine

## 2021-01-20 VITALS — BP 160/100 | HR 90 | Ht 66.0 in | Wt 167.0 lb

## 2021-01-20 DIAGNOSIS — A048 Other specified bacterial intestinal infections: Secondary | ICD-10-CM | POA: Diagnosis not present

## 2021-01-20 DIAGNOSIS — D509 Iron deficiency anemia, unspecified: Secondary | ICD-10-CM

## 2021-01-20 DIAGNOSIS — K259 Gastric ulcer, unspecified as acute or chronic, without hemorrhage or perforation: Secondary | ICD-10-CM | POA: Diagnosis not present

## 2021-01-20 DIAGNOSIS — R109 Unspecified abdominal pain: Secondary | ICD-10-CM | POA: Diagnosis not present

## 2021-01-20 MED ORDER — TALICIA 250-12.5-10 MG PO CPDR: 4.0000 | DELAYED_RELEASE_CAPSULE | Freq: Three times a day (TID) | ORAL | 0 refills | Status: DC

## 2021-01-20 NOTE — Progress Notes (Signed)
Chief Complaint: H pylori, IDA, gastric erosions  HPI : 40 year old female with history of DM and HTN presents with H pylori, gastric erosions, and IDA  Interval History: She feels like overall on the omeprazole medication that her abdominal pain and diarrhea have improved.  She is having 1-2 BMs per day. Her ab pain is much improved as well. She did have some pain related to her menstrual period.  Denies having heavy periods.  Her blood sugars are under better control with the help of her primary care physician.  She has received an IV iron infusion.  Current Outpatient Medications  Medication Sig Dispense Refill   Continuous Blood Gluc Receiver (FREESTYLE LIBRE 2 READER) DEVI See admin instructions.     insulin degludec (TRESIBA FLEXTOUCH) 200 UNIT/ML FlexTouch Pen Tresiba FlexTouch U-200 insulin 200 unit/mL (3 mL) subcutaneous pen     Iron-FA-B Cmp-C-Biot-Probiotic (FUSION PLUS) CAPS Take 1 capsule by mouth daily.     omeprazole (PRILOSEC) 40 MG capsule TAKE 1 CAPSULE (40 MG TOTAL) BY MOUTH IN THE MORNING AND AT BEDTIME. 60 capsule 1   rosuvastatin (CRESTOR) 10 MG tablet rosuvastatin 10 mg tablet  TAKE 1 TABLET BY MOUTH EVERY DAY     RYBELSUS 7 MG TABS Take 1 tablet by mouth daily.     No current facility-administered medications for this visit.   Review of Systems: All systems reviewed and negative except where noted in HPI.   Physical Exam: BP (!) 160/100    Pulse 90    Ht 5\' 6"  (1.676 m)    Wt 167 lb (75.8 kg)    LMP 01/09/2021    SpO2 95%    BMI 26.95 kg/m  Constitutional: Pleasant,well-developed, female in no acute distress. HEENT: Normocephalic and atraumatic. Conjunctivae are normal. No scleral icterus. Cardiovascular: Normal rate, regular rhythm.  Pulmonary/chest: Effort normal and breath sounds normal. No wheezing, rales or rhonchi. Abdominal: Soft, nondistended, non-tender Extremities: No edema Neurological: Alert and oriented to person place and time. Skin: Skin is  warm and dry. No rashes noted. Psychiatric: Normal mood and affect. Behavior is normal.  Labs 06/2020: HbA1C 12.2%, TSH nml, CBC with Hb 10.9, CMP with glucose 287, Na 132  Labs 10/2020: Hb 9.5, ferritin 37, iron 114  Labs 11/2020: GI path panel and O&P were negative. Pancreatic elastase was normal.  EGD 12/01/20: - Normal esophagus. Biopsied. - Gastritis. Recently bled. Biopsied. - Normal examined duodenum. Path: 1. Surgical [P], gastric bxs - ANTRAL AND OXYNTIC MUCOSA WITH CHRONIC HELICOBACTER PYLORI GASTRITIS. - WARTHIN-STARRY POSITIVE FOR HELICOBACTER PYLORI. 2. Surgical [P], esophageal - UNREMARKABLE SQUAMOUS MUCOSA. - NO EOSINOPHILIC ESOPHAGITIS (LESS THAN 2 PER HIGH POWER FIELD).  Colonoscopy 12/01/20: - The examined portion of the ileum was normal. - Erythematous mucosa in the entire examined colon. Biopsied. - Non-bleeding internal hemorrhoids. Path: 3. Surgical [P], random colon - UNREMARKABLE COLONIC MUCOSA. - NO MICROSCOPIC COLITIS, ACTIVE INFLAMMATION OR CHRONIC CHANGES.  ASSESSMENT AND PLAN: H pylori infection Gastric erosions IDA Abdominal discomfort Patient has overall had improvement in her symptoms on PPI twice daily therapy.  Her last EGD showed infection with H. pylori.  Will start her on treatment for H. pylori and then test for eradication.  Patient is already been on treatment for her IDA with IV iron infusions.  I suspect that this time that her IDA is due to infection with H. pylori. - Start 12/03/20. About 4 weeks after completion of therapy, she will need to be checked for eradication with  stool H pylori antigen. PPI will need to be held 2 weeks prior to checking for eradication - She has an upcoming lab check with hematology in a few weeks - RTC in 3 months  Christia Reading, MD  I spent 36 minutes of time, including in depth chart review, independent review of results as outlined above, communicating results with the patient directly, face-to-face time  with the patient, coordinating care, and ordering studies and medications as appropriate, and documentation.

## 2021-01-20 NOTE — Patient Instructions (Addendum)
If you are age 39 or older, your body mass index should be between 23-30. Your Body mass index is 26.95 kg/m. If this is out of the aforementioned range listed, please consider follow up with your Primary Care Provider.  If you are age 47 or younger, your body mass index should be between 19-25. Your Body mass index is 26.95 kg/m. If this is out of the aformentioned range listed, please consider follow up with your Primary Care Provider.   ________________________________________________________  The [employer] [facility] providers would like to encourage you to use [facility] to communicate with providers for non-urgent requests or questions.  Due to long hold times on the telephone, sending your provider a message by [facility] may be a faster and more efficient way to get a response.  Please allow 48 business hours for a response.  Please remember that this is for non-urgent requests.  _______________________________________________________  We have sent the following medications to your pharmacy for you to pick up at your convenience:  Phoebe Perch  Stop your Omeprazole for the 2 weeks you are on Talicia.    Come in to the lab in approximately 4 weeks for a stool test - stop your Omeprazole for 2 weeks prior to that.    Please follow up in three months

## 2021-01-25 ENCOUNTER — Encounter (INDEPENDENT_AMBULATORY_CARE_PROVIDER_SITE_OTHER): Payer: 59 | Admitting: Ophthalmology

## 2021-01-25 ENCOUNTER — Other Ambulatory Visit: Payer: Self-pay

## 2021-01-25 DIAGNOSIS — E113512 Type 2 diabetes mellitus with proliferative diabetic retinopathy with macular edema, left eye: Secondary | ICD-10-CM

## 2021-01-26 ENCOUNTER — Other Ambulatory Visit: Payer: Self-pay | Admitting: Internal Medicine

## 2021-01-29 ENCOUNTER — Inpatient Hospital Stay: Payer: 59 | Attending: Family

## 2021-01-29 ENCOUNTER — Inpatient Hospital Stay: Payer: 59 | Admitting: Family

## 2021-02-02 ENCOUNTER — Other Ambulatory Visit: Payer: 59

## 2021-02-02 ENCOUNTER — Ambulatory Visit: Payer: 59 | Admitting: Family

## 2021-02-16 ENCOUNTER — Encounter (INDEPENDENT_AMBULATORY_CARE_PROVIDER_SITE_OTHER): Payer: 59 | Admitting: Ophthalmology

## 2021-02-16 ENCOUNTER — Other Ambulatory Visit: Payer: Self-pay

## 2021-02-24 ENCOUNTER — Inpatient Hospital Stay: Payer: 59 | Attending: Hematology & Oncology

## 2021-02-24 ENCOUNTER — Inpatient Hospital Stay: Payer: 59 | Admitting: Family

## 2021-03-03 ENCOUNTER — Encounter (INDEPENDENT_AMBULATORY_CARE_PROVIDER_SITE_OTHER): Payer: 59 | Admitting: Ophthalmology

## 2021-03-03 ENCOUNTER — Other Ambulatory Visit: Payer: Self-pay

## 2021-03-03 DIAGNOSIS — I10 Essential (primary) hypertension: Secondary | ICD-10-CM

## 2021-03-03 DIAGNOSIS — H35373 Puckering of macula, bilateral: Secondary | ICD-10-CM

## 2021-03-03 DIAGNOSIS — E113513 Type 2 diabetes mellitus with proliferative diabetic retinopathy with macular edema, bilateral: Secondary | ICD-10-CM

## 2021-03-03 DIAGNOSIS — H43813 Vitreous degeneration, bilateral: Secondary | ICD-10-CM

## 2021-03-03 DIAGNOSIS — H35033 Hypertensive retinopathy, bilateral: Secondary | ICD-10-CM | POA: Diagnosis not present

## 2021-03-26 ENCOUNTER — Telehealth: Payer: Self-pay | Admitting: *Deleted

## 2021-03-26 NOTE — Telephone Encounter (Signed)
Message received from patient requesting to come in for lab work.  Message sent to scheduling to call pt and schedule her for labs and to see Sarah NP.

## 2021-03-30 ENCOUNTER — Inpatient Hospital Stay: Payer: 59 | Attending: Hematology & Oncology

## 2021-03-30 ENCOUNTER — Telehealth: Payer: Self-pay | Admitting: Hematology and Oncology

## 2021-03-30 ENCOUNTER — Inpatient Hospital Stay (HOSPITAL_BASED_OUTPATIENT_CLINIC_OR_DEPARTMENT_OTHER): Payer: 59 | Admitting: Family

## 2021-03-30 ENCOUNTER — Other Ambulatory Visit: Payer: Self-pay

## 2021-03-30 ENCOUNTER — Encounter: Payer: Self-pay | Admitting: Family

## 2021-03-30 ENCOUNTER — Telehealth: Payer: Self-pay | Admitting: *Deleted

## 2021-03-30 VITALS — BP 167/85 | HR 86 | Temp 98.4°F | Resp 18 | Ht 66.0 in | Wt 176.1 lb

## 2021-03-30 DIAGNOSIS — D509 Iron deficiency anemia, unspecified: Secondary | ICD-10-CM | POA: Insufficient documentation

## 2021-03-30 DIAGNOSIS — D500 Iron deficiency anemia secondary to blood loss (chronic): Secondary | ICD-10-CM

## 2021-03-30 LAB — CBC WITH DIFFERENTIAL (CANCER CENTER ONLY)
Abs Immature Granulocytes: 0.07 10*3/uL (ref 0.00–0.07)
Basophils Absolute: 0 10*3/uL (ref 0.0–0.1)
Basophils Relative: 0 %
Eosinophils Absolute: 0.2 10*3/uL (ref 0.0–0.5)
Eosinophils Relative: 3 %
HCT: 23.6 % — ABNORMAL LOW (ref 36.0–46.0)
Hemoglobin: 8.2 g/dL — ABNORMAL LOW (ref 12.0–15.0)
Immature Granulocytes: 1 %
Lymphocytes Relative: 25 %
Lymphs Abs: 1.9 10*3/uL (ref 0.7–4.0)
MCH: 31.7 pg (ref 26.0–34.0)
MCHC: 34.7 g/dL (ref 30.0–36.0)
MCV: 91.1 fL (ref 80.0–100.0)
Monocytes Absolute: 0.5 10*3/uL (ref 0.1–1.0)
Monocytes Relative: 7 %
Neutro Abs: 4.8 10*3/uL (ref 1.7–7.7)
Neutrophils Relative %: 64 %
Platelet Count: 237 10*3/uL (ref 150–400)
RBC: 2.59 MIL/uL — ABNORMAL LOW (ref 3.87–5.11)
RDW: 12.1 % (ref 11.5–15.5)
WBC Count: 7.5 10*3/uL (ref 4.0–10.5)
nRBC: 0 % (ref 0.0–0.2)

## 2021-03-30 LAB — RETICULOCYTES
Immature Retic Fract: 9.5 % (ref 2.3–15.9)
RBC.: 2.6 MIL/uL — ABNORMAL LOW (ref 3.87–5.11)
Retic Count, Absolute: 89.7 10*3/uL (ref 19.0–186.0)
Retic Ct Pct: 3.5 % — ABNORMAL HIGH (ref 0.4–3.1)

## 2021-03-30 LAB — IRON AND IRON BINDING CAPACITY (CC-WL,HP ONLY)
Iron: 60 ug/dL (ref 28–170)
Saturation Ratios: 23 % (ref 10.4–31.8)
TIBC: 259 ug/dL (ref 250–450)
UIBC: 199 ug/dL (ref 148–442)

## 2021-03-30 LAB — FERRITIN: Ferritin: 239 ng/mL (ref 11–307)

## 2021-03-30 NOTE — Progress Notes (Signed)
Hematology and Oncology Follow Up Visit  Zarra Rieck [id number] 1980/03/17 41 y.o. 03/30/2021   Principle Diagnosis:  Iron deficiency anemia   Current Therapy:   IV iron as indicated   Interim History:  [name] is here today for follow-up. She is doing well and has no complaints at this time. She tolerated Monferric nicely and feels that her energy is a little better.  Her cycles has been lighter but she still notes spotting beforehand. No other blood loss noted.  No bruising or petechiae.  She denies fever, chills, n/v, cough, rash, dizziness, SOB, chest pain, palpitations, abdominal pain or changes in bowel or bladder habits.  She has noted sweating on her forehead and the back or her head after she eats and plans to discuss with her GI doctor.  No swelling, tenderness, numbness or tingling in her extremities.  No falls or syncope.  She has maintained a good appetite and is staying well hydrated. Her weight is stable at 176 lbs.   ECOG Performance Status: 1 - Symptomatic but completely ambulatory  Medications:  Allergies as of 03/30/2021       Reactions   Seasonal Ic [cholestatin]         Medication List        Accurate as of March 30, 2021  8:31 AM. If you have any questions, ask your nurse or doctor.          STOP taking these medications    FreeStyle [place]hard 2 Reader Kerrin Mo Stopped by: Lottie Dawson, NP   Pat Kocher [id number] MG Cpdr Generic drug: Amoxicill-Rifabutin-Omeprazole Stopped by: Lottie Dawson, NP       TAKE these medications    amLODipine 2.5 MG tablet Commonly known as: NORVASC Take 2.5 mg by mouth daily.   Dexcom G6 Sensor Misc See admin instructions.   Dexcom G6 Transmitter Misc See admin instructions.   Fusion Plus Caps Take 1 capsule by mouth daily.   Insulin Syringe-Needle U-100 31G X 5/16" 0.3 ML Misc BD Ultra-Fine Short Pen Needle 31 gauge x 5/16"  USE AS DIRECTED TO INJECT TRESIBA INSULIN SUBCUTANEOUSLY AT BEDTIME    omeprazole 40 MG capsule Commonly known as: PRILOSEC TAKE 1 CAPSULE (40 MG TOTAL) BY MOUTH IN THE MORNING AND AT BEDTIME.   rosuvastatin 10 MG tablet Commonly known as: CRESTOR rosuvastatin 10 mg tablet  TAKE 1 TABLET BY MOUTH EVERY DAY   Rybelsus 7 MG Tabs Generic drug: Semaglutide Take 1 tablet by mouth daily.   Tyler Aas FlexTouch 200 UNIT/ML FlexTouch Pen Generic drug: insulin degludec Tresiba FlexTouch U-200 insulin 200 unit/mL (3 mL) subcutaneous pen        Allergies:  Allergies  Allergen Reactions   Seasonal Ic [Cholestatin]     Past Medical History, Surgical history, Social history, and Family History were reviewed and updated.  Review of Systems: All other 10 point review of systems is negative.   Physical Exam:  height is 5\' 6"  (1.676 m) and weight is 176 lb 1.9 oz (79.9 kg). Her oral temperature is 98.4 F (36.9 C). Her blood pressure is 167/85 (abnormal) and her pulse is 86. Her respiration is 18 and oxygen saturation is 100%.   Wt Readings from Last 3 Encounters:  03/30/21 176 lb 1.9 oz (79.9 kg)  01/20/21 167 lb (75.8 kg)  12/02/20 175 lb 8 oz (79.6 kg)    Ocular: Sclerae unicteric, pupils equal, round and reactive to light Ear-nose-throat: Oropharynx clear, dentition fair Lymphatic: No cervical or supraclavicular adenopathy Lungs no  rales or rhonchi, good excursion bilaterally Heart regular rate and rhythm, no murmur appreciated Abd soft, nontender, positive bowel sounds MSK no focal spinal tenderness, no joint edema Neuro: non-focal, well-oriented, appropriate affect Breasts: Deferred   Lab Results  Component Value Date   WBC 7.5 03/30/2021   HGB 8.2 (L) 03/30/2021   HCT 23.6 (L) 03/30/2021   MCV 91.1 03/30/2021   PLT 237 03/30/2021   Lab Results  Component Value Date   FERRITIN 39 12/02/2020   IRON 26 (L) 12/02/2020   TIBC 283 12/02/2020   UIBC 256 12/02/2020   IRONPCTSAT 9 (L) 12/02/2020   Lab Results  Component Value Date    RETICCTPCT 3.5 (H) 03/30/2021   RBC 2.59 (L) 03/30/2021   RBC 2.60 (L) 03/30/2021   No results found for: KPAFRELGTCHN, LAMBDASER, KAPLAMBRATIO No results found for: IGGSERUM, IGA, IGMSERUM No results found for: Odetta Pink, SPEI   Chemistry      Component Value Date/Time   NA 135 12/02/2020 0850   K 4.4 12/02/2020 0850   CL 108 12/02/2020 0850   CO2 21 (L) 12/02/2020 0850   BUN 18 12/02/2020 0850   CREATININE 0.91 12/02/2020 0850      Component Value Date/Time   CALCIUM 8.2 (L) 12/02/2020 0850   ALKPHOS 54 12/02/2020 0850   AST 18 12/02/2020 0850   ALT 18 12/02/2020 0850   BILITOT 0.2 (L) 12/02/2020 0850       Impression and Plan: [name] is a very pleasant 41 yo African American female with history of iron deficiency anemia.  Iron studies are pending. We will replace if needed.  Follow-up in 3 months.   Lottie Dawson, NP 2/21/20238:31 AM

## 2021-03-30 NOTE — Telephone Encounter (Signed)
Scheduled appt per 2/20 referral. Pt is aware of appt date and time. Pt is aware to arrive 15 mins prior to appt time and to bring and updated insurance card. Pt is aware of appt location.   °

## 2021-03-30 NOTE — Telephone Encounter (Signed)
Per 03/30/21 los - called and gave upcoming appointments - confirmed °

## 2021-03-31 ENCOUNTER — Encounter (INDEPENDENT_AMBULATORY_CARE_PROVIDER_SITE_OTHER): Payer: 59 | Admitting: Ophthalmology

## 2021-03-31 DIAGNOSIS — E113513 Type 2 diabetes mellitus with proliferative diabetic retinopathy with macular edema, bilateral: Secondary | ICD-10-CM | POA: Diagnosis not present

## 2021-03-31 DIAGNOSIS — H35373 Puckering of macula, bilateral: Secondary | ICD-10-CM | POA: Diagnosis not present

## 2021-03-31 DIAGNOSIS — H43813 Vitreous degeneration, bilateral: Secondary | ICD-10-CM

## 2021-03-31 DIAGNOSIS — I10 Essential (primary) hypertension: Secondary | ICD-10-CM

## 2021-03-31 DIAGNOSIS — H35033 Hypertensive retinopathy, bilateral: Secondary | ICD-10-CM | POA: Diagnosis not present

## 2021-03-31 DIAGNOSIS — H2513 Age-related nuclear cataract, bilateral: Secondary | ICD-10-CM

## 2021-04-01 NOTE — Progress Notes (Signed)
[place] CONSULT NOTE  Patient Care Team: Long, Nicki Reaper, PA-C as PCP - General (Physician Assistant)  CHIEF COMPLAINTS/PURPOSE OF CONSULTATION:  Newly diagnosed anemia  HISTORY OF PRESENTING ILLNESS:  Kimberly Young 41 y.o. female is here because of recent diagnosis of anemia. She presents to the clinic today for initial evaluation and discussion of treatment options.  Patient has diabetes and chronic kidney disease and was noted on recent blood work on 03/19/2021 to have a hemoglobin of 8.6 with normal iron studies and R51 and folic acid.  She has chronic kidney disease from diabetes.  She was referred to [place] for evaluation of the normocytic anemia. She tells me that she has had symptoms of anemia going on for several years but only 3 months ago she was diagnosed with anemia and was started on supplements for iron.  It appears that her hemoglobin continued to decline in spite of that and that resulted in the consultation for her to see [place].  I reviewed her records extensively and collaborated the history with the patient.  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  MEDICATIONS: Amlodipine 2.5 mg daily,Besivance eyedrops, rosuvastatin 10 mg daily, Rybelsus 14 mg daily, omeprazole 40 mg daily, fusion +130 mg iron-1250 mcg capsule daily  REVIEW OF SYSTEMS:   Constitutional: Denies fevers, chills or abnormal night sweats Eyes: Denies blurriness of vision, double vision or watery eyes Ears, nose, mouth, throat, and face: Denies mucositis or sore throat Respiratory: Denies cough, dyspnea or wheezes Cardiovascular: Denies palpitation, chest discomfort or lower extremity  swelling Gastrointestinal:  Denies nausea, heartburn or change in bowel habits Skin: Denies abnormal skin rashes Lymphatics: Denies new lymphadenopathy or easy bruising Neurological:Denies numbness, tingling or new weaknesses Behavioral/Psych: Mood is stable, no new changes  All other systems were reviewed with the patient and are negative.  PHYSICAL EXAMINATION: ECOG PERFORMANCE STATUS: 1 - Symptomatic but completely ambulatory  Vitals:   04/02/21 1346  BP: (!) 147/82  Pulse: 89  Resp: 18  Temp: 97.9 F (36.6 C)  SpO2: 100%   Filed Weights   04/02/21 1346  Weight: 173 lb 9.6 oz (78.7 kg)      ASSESSMENT AND PLAN:  Normocytic anemia Lab review: 03/22/2021: Hemoglobin 8.6, MCV 93.4, RDW 12.1, platelets 340, WBC 10.6, glucose 249, creatinine 1.27, calcium 8.2, albumin 2.9, ferritin 252, iron saturation 38%, celiac antibodies negative, B12 670, folate greater than 24 A1c 7.3  Differential diagnosis: 1. Anemia due to chronic disease and inflammation 2. anemia due to renal dysfunction 3. Hemolysis 5. Hypothyroidism 6. Plasma cell disorders myeloma 7. Bone marrow dysfunction with MDS  Workup performed: 1. Haptoglobin, LDH, reticulocyte count to evaluate hemolysis 2. TSH 3. SPEP 4.  Erythropoietin level 5.  Recheck iron studies O84 and folic acid  Based on the above results we may need to do a bone marrow biopsy for further evaluation 1 week telephone visit to discuss results    All questions were answered. The patient knows to call the clinic with any problems, questions or concerns.   Rulon Eisenmenger, MD, MPH 04/02/2021    I, Thana Ates, am acting as scribe for Nicholas Lose, MD.  I have reviewed the above documentation for accuracy and completeness, and I agree with  the above.

## 2021-04-02 ENCOUNTER — Other Ambulatory Visit: Payer: Self-pay

## 2021-04-02 ENCOUNTER — Inpatient Hospital Stay: Payer: 59 | Attending: Hematology and Oncology | Admitting: Hematology and Oncology

## 2021-04-02 ENCOUNTER — Inpatient Hospital Stay: Payer: 59

## 2021-04-02 DIAGNOSIS — D649 Anemia, unspecified: Secondary | ICD-10-CM

## 2021-04-02 DIAGNOSIS — N189 Chronic kidney disease, unspecified: Secondary | ICD-10-CM | POA: Insufficient documentation

## 2021-04-02 DIAGNOSIS — E785 Hyperlipidemia, unspecified: Secondary | ICD-10-CM | POA: Diagnosis not present

## 2021-04-02 DIAGNOSIS — I129 Hypertensive chronic kidney disease with stage 1 through stage 4 chronic kidney disease, or unspecified chronic kidney disease: Secondary | ICD-10-CM | POA: Insufficient documentation

## 2021-04-02 DIAGNOSIS — Z79899 Other long term (current) drug therapy: Secondary | ICD-10-CM | POA: Diagnosis not present

## 2021-04-02 DIAGNOSIS — Z833 Family history of diabetes mellitus: Secondary | ICD-10-CM | POA: Insufficient documentation

## 2021-04-02 DIAGNOSIS — E1122 Type 2 diabetes mellitus with diabetic chronic kidney disease: Secondary | ICD-10-CM | POA: Diagnosis not present

## 2021-04-02 LAB — RETIC PANEL
Immature Retic Fract: 8.7 % (ref 2.3–15.9)
RBC.: 2.67 MIL/uL — ABNORMAL LOW (ref 3.87–5.11)
Retic Count, Absolute: 74.8 10*3/uL (ref 19.0–186.0)
Retic Ct Pct: 2.8 % (ref 0.4–3.1)
Reticulocyte Hemoglobin: 35.3 pg (ref >27.9)

## 2021-04-02 LAB — CBC WITH DIFFERENTIAL (CANCER CENTER ONLY)
Abs Immature Granulocytes: 0.01 10*3/uL (ref 0.00–0.07)
Basophils Absolute: 0 10*3/uL (ref 0.0–0.1)
Basophils Relative: 0 %
Eosinophils Absolute: 0.1 10*3/uL (ref 0.0–0.5)
Eosinophils Relative: 2 %
HCT: 24.8 % — ABNORMAL LOW (ref 36.0–46.0)
Hemoglobin: 8.5 g/dL — ABNORMAL LOW (ref 12.0–15.0)
Immature Granulocytes: 0 %
Lymphocytes Relative: 26 %
Lymphs Abs: 1.9 10*3/uL (ref 0.7–4.0)
MCH: 31.3 pg (ref 26.0–34.0)
MCHC: 34.3 g/dL (ref 30.0–36.0)
MCV: 91.2 fL (ref 80.0–100.0)
Monocytes Absolute: 0.4 10*3/uL (ref 0.1–1.0)
Monocytes Relative: 5 %
Neutro Abs: 5.1 10*3/uL (ref 1.7–7.7)
Neutrophils Relative %: 67 %
Platelet Count: 264 10*3/uL (ref 150–400)
RBC: 2.72 MIL/uL — ABNORMAL LOW (ref 3.87–5.11)
RDW: 12.6 % (ref 11.5–15.5)
WBC Count: 7.5 10*3/uL (ref 4.0–10.5)
nRBC: 0 % (ref 0.0–0.2)

## 2021-04-02 LAB — FERRITIN: Ferritin: 222 ng/mL (ref 11–307)

## 2021-04-02 LAB — IRON AND IRON BINDING CAPACITY (CC-WL,HP ONLY)
Iron: 128 ug/dL (ref 28–170)
Saturation Ratios: 46 % — ABNORMAL HIGH (ref 10.4–31.8)
TIBC: 281 ug/dL (ref 250–450)
UIBC: 153 ug/dL (ref 148–442)

## 2021-04-02 LAB — TSH: TSH: 2.079 u[IU]/mL (ref 0.308–3.960)

## 2021-04-02 LAB — FOLATE: Folate: 81.7 ng/mL (ref >5.9)

## 2021-04-02 LAB — LACTATE DEHYDROGENASE: LDH: 185 U/L (ref 98–192)

## 2021-04-02 LAB — VITAMIN B12: Vitamin B-12: 735 pg/mL (ref 180–914)

## 2021-04-02 NOTE — Assessment & Plan Note (Signed)
Lab review: 03/22/2021: Hemoglobin 8.6, MCV 93.4, RDW 12.1, platelets 340, WBC 10.6, glucose 249, creatinine 1.27, calcium 8.2, albumin 2.9, ferritin 252, iron saturation 38%, celiac antibodies negative, B12 670, folate greater than 24 A1c 7.3  Differential diagnosis: 1. Anemia due to chronic disease and inflammation 2. anemia due to renal dysfunction 3. Hemolysis 5. Hypothyroidism 6. Plasma cell disorders myeloma 7. Bone marrow dysfunction with MDS  Workup performed: 1. Haptoglobin, LDH, reticulocyte count to evaluate hemolysis 2. TSH 3. SPEP  Based on the above results we may need to do a bone marrow biopsy for further evaluation 4.  Erythropoietin level

## 2021-04-03 LAB — ERYTHROPOIETIN: Erythropoietin: 9.2 m[IU]/mL (ref 2.6–18.5)

## 2021-04-05 LAB — MULTIPLE MYELOMA PANEL, SERUM
Albumin SerPl Elph-Mcnc: 2.7 g/dL — ABNORMAL LOW (ref 2.9–4.4)
Albumin/Glob SerPl: 1.1 (ref 0.7–1.7)
Alpha 1: 0.3 g/dL (ref 0.0–0.4)
Alpha2 Glob SerPl Elph-Mcnc: 0.7 g/dL (ref 0.4–1.0)
B-Globulin SerPl Elph-Mcnc: 0.9 g/dL (ref 0.7–1.3)
Gamma Glob SerPl Elph-Mcnc: 0.8 g/dL (ref 0.4–1.8)
Globulin, Total: 2.7 g/dL (ref 2.2–3.9)
IgA: 208 mg/dL (ref 87–352)
IgG (Immunoglobin G), Serum: 890 mg/dL (ref 586–1602)
IgM (Immunoglobulin M), Srm: 45 mg/dL (ref 26–217)
Total Protein ELP: 5.4 g/dL — ABNORMAL LOW (ref 6.0–8.5)

## 2021-04-07 LAB — HGB FRACTIONATION CASCADE
Hgb A2: 2.8 % (ref 1.8–3.2)
Hgb A: 96.8 % (ref 96.4–98.8)
Hgb F: 0.4 % (ref 0.0–2.0)
Hgb S: 0 %

## 2021-04-07 NOTE — Progress Notes (Signed)
?  HEMATOLOGY-ONCOLOGY TELEPHONE VISIT PROGRESS NOTE ? ?I connected with Kimberly Young on 04/08/2021 at  8:15 AM EST by telephone and verified that I am speaking with the correct person using two identifiers.  ?I discussed the limitations, risks, security and privacy concerns of performing an evaluation and management service by telephone and the availability of in person appointments.  ?I also discussed with the patient that there may be a patient responsible charge related to this service. The patient expressed understanding and agreed to proceed.  ? ?History of Present Illness: Kimberly Young is a 41 y.o. female with above-mentioned history of anemia. She presents via telephone today for follow-up to discuss the results of recent blood work ? ?Observations/Objective:  ?  ?Assessment Plan:  ?IDA (iron deficiency anemia) ?03/22/2021: Hemoglobin 8.6, MCV 93.4, RDW 12.1, platelets 340, WBC 10.6, glucose 249, creatinine 1.27, calcium 8.2, albumin 2.9, ferritin 252, iron saturation 38%, celiac antibodies negative, B12 670, folate greater than 24 A1c 7.3 ? ?Lab review: Hemoglobin 8.5, MCV 91.2, erythropoietin 9.2, B12 735, folate 81.7, iron saturation 46%, hemoglobin electrophoresis: Normal, TSH 2, reticulocyte absolute 74.8 ?LDH 185: No evidence of hemolysis ? ?Essentially be ruled out nutritional deficiencies, hemolysis, myeloma, thyroid disorders and came to the conclusion that her anemia is due to anemia of chronic disease with low erythropoietin levels ? ?Plan: We could consider Aranesp therapy ? ?Return to clinic every 3 weeks for Aranesp and every 9 weeks to follow-up with me. ?If the hemoglobin does not improve with Aranesp then we will have to do a bone marrow biopsy. ? ? ? ?I discussed the assessment and treatment plan with the patient. The patient was provided an opportunity to ask questions and all were answered. The patient agreed with the plan and demonstrated an understanding of the instructions. The  patient was advised to call back or seek an in-person evaluation if the symptoms worsen or if the condition fails to improve as anticipated.  ? ?Total time spent: 15 mins including non-face to face time and time spent for planning, charting and coordination of care ? ?Rulon Eisenmenger, MD ?04/08/2021  ? ? I, Thana Ates, am acting as scribe for Nicholas Lose, MD. ? ?I have reviewed the above documentation for accuracy and completeness, and I agree with the above. ?  ? ?

## 2021-04-07 NOTE — Assessment & Plan Note (Signed)
03/22/2021: Hemoglobin 8.6, MCV 93.4, RDW 12.1, platelets 340, WBC 10.6, glucose 249, creatinine 1.27, calcium 8.2, albumin 2.9, ferritin 252, iron saturation 38%, celiac antibodies negative, B12 670, folate greater than 24 A1c 7.3 ? ?Lab review: Hemoglobin 8.5, MCV 91.2, erythropoietin 9.2, B12 735, folate 81.7, iron saturation 46%, hemoglobin electrophoresis: Normal, TSH 2, reticulocyte absolute 74.8 ?LDH 185: No evidence of hemolysis ? ?Plan: ?Most likely cause of anemia is anemia of chronic disease. ?We could consider Aranesp therapy ? ?Return to clinic every 3 weeks for Aranesp and every 6 weeks to follow-up with me. ? ?

## 2021-04-08 ENCOUNTER — Inpatient Hospital Stay: Payer: 59 | Attending: Hematology and Oncology | Admitting: Hematology and Oncology

## 2021-04-08 DIAGNOSIS — D509 Iron deficiency anemia, unspecified: Secondary | ICD-10-CM | POA: Insufficient documentation

## 2021-04-08 DIAGNOSIS — Z79899 Other long term (current) drug therapy: Secondary | ICD-10-CM | POA: Insufficient documentation

## 2021-04-12 ENCOUNTER — Encounter: Payer: Self-pay | Admitting: Hematology and Oncology

## 2021-04-15 ENCOUNTER — Telehealth: Payer: Self-pay | Admitting: Hematology and Oncology

## 2021-04-15 NOTE — Telephone Encounter (Signed)
Scheduled appointment per 3/2 los. Called patient and left message. Called patients spouse and he is aware of patient's upcoming appointment. ?

## 2021-04-15 NOTE — Telephone Encounter (Signed)
Scheduled appointment per 3/2 los. Called and left message with patient. ?

## 2021-04-20 ENCOUNTER — Other Ambulatory Visit: Payer: Self-pay

## 2021-04-20 ENCOUNTER — Inpatient Hospital Stay: Payer: 59

## 2021-04-20 VITALS — BP 151/95 | HR 87 | Temp 98.9°F

## 2021-04-20 DIAGNOSIS — D509 Iron deficiency anemia, unspecified: Secondary | ICD-10-CM | POA: Diagnosis present

## 2021-04-20 DIAGNOSIS — Z79899 Other long term (current) drug therapy: Secondary | ICD-10-CM | POA: Diagnosis not present

## 2021-04-20 DIAGNOSIS — D500 Iron deficiency anemia secondary to blood loss (chronic): Secondary | ICD-10-CM

## 2021-04-20 LAB — CBC WITH DIFFERENTIAL (CANCER CENTER ONLY)
Abs Immature Granulocytes: 0.02 10*3/uL (ref 0.00–0.07)
Basophils Absolute: 0 10*3/uL (ref 0.0–0.1)
Basophils Relative: 1 %
Eosinophils Absolute: 0.2 10*3/uL (ref 0.0–0.5)
Eosinophils Relative: 3 %
HCT: 26 % — ABNORMAL LOW (ref 36.0–46.0)
Hemoglobin: 8.7 g/dL — ABNORMAL LOW (ref 12.0–15.0)
Immature Granulocytes: 0 %
Lymphocytes Relative: 27 %
Lymphs Abs: 1.9 10*3/uL (ref 0.7–4.0)
MCH: 31.3 pg (ref 26.0–34.0)
MCHC: 33.5 g/dL (ref 30.0–36.0)
MCV: 93.5 fL (ref 80.0–100.0)
Monocytes Absolute: 0.4 10*3/uL (ref 0.1–1.0)
Monocytes Relative: 5 %
Neutro Abs: 4.5 10*3/uL (ref 1.7–7.7)
Neutrophils Relative %: 64 %
Platelet Count: 304 10*3/uL (ref 150–400)
RBC: 2.78 MIL/uL — ABNORMAL LOW (ref 3.87–5.11)
RDW: 12.4 % (ref 11.5–15.5)
WBC Count: 6.9 10*3/uL (ref 4.0–10.5)
nRBC: 0 % (ref 0.0–0.2)

## 2021-04-20 LAB — RETICULOCYTES
Immature Retic Fract: 10.3 % (ref 2.3–15.9)
RBC.: 2.82 MIL/uL — ABNORMAL LOW (ref 3.87–5.11)
Retic Count, Absolute: 74.4 10*3/uL (ref 19.0–186.0)
Retic Ct Pct: 2.6 % (ref 0.4–3.1)

## 2021-04-20 LAB — IRON AND IRON BINDING CAPACITY (CC-WL,HP ONLY)
Iron: 75 ug/dL (ref 28–170)
Saturation Ratios: 27 % (ref 10.4–31.8)
TIBC: 276 ug/dL (ref 250–450)
UIBC: 201 ug/dL (ref 148–442)

## 2021-04-20 LAB — FERRITIN: Ferritin: 169 ng/mL (ref 11–307)

## 2021-04-20 MED ORDER — DARBEPOETIN ALFA 300 MCG/0.6ML IJ SOSY
300.0000 ug | PREFILLED_SYRINGE | Freq: Once | INTRAMUSCULAR | Status: AC
  Administered 2021-04-20: 300 ug via SUBCUTANEOUS
  Filled 2021-04-20: qty 0.6

## 2021-04-20 NOTE — Patient Instructions (Signed)
Darbepoetin Alfa injection ?What is this medication? ?DARBEPOETIN ALFA (dar be POE e tin  AL fa) helps your body make more red blood cells. It is used to treat anemia caused by chronic kidney failure and chemotherapy. ?This medicine may be used for other purposes; ask your health care provider or pharmacist if you have questions. ?COMMON BRAND NAME(S): Aranesp ?What should I tell my care team before I take this medication? ?They need to know if you have any of these conditions: ?blood clotting disorders or history of blood clots ?cancer patient not on chemotherapy ?cystic fibrosis ?heart disease, such as angina, heart failure, or a history of a heart attack ?hemoglobin level of 12 g/dL or greater ?high blood pressure ?low levels of folate, iron, or vitamin B12 ?seizures ?an unusual or allergic reaction to darbepoetin, erythropoietin, albumin, hamster proteins, latex, other medicines, foods, dyes, or preservatives ?pregnant or trying to get pregnant ?breast-feeding ?How should I use this medication? ?This medicine is for injection into a vein or under the skin. It is usually given by a health care professional in a hospital or clinic setting. ?If you get this medicine at home, you will be taught how to prepare and give this medicine. Use exactly as directed. Take your medicine at regular intervals. Do not take your medicine more often than directed. ?It is important that you put your used needles and syringes in a special sharps container. Do not put them in a trash can. If you do not have a sharps container, call your pharmacist or healthcare provider to get one. ?A special MedGuide will be given to you by the pharmacist with each prescription and refill. Be sure to read this information carefully each time. ?Talk to your pediatrician regarding the use of this medicine in children. While this medicine may be used in children as young as 1 month of age for selected conditions, precautions do apply. ?Overdosage: If  you think you have taken too much of this medicine contact a poison control center or emergency room at once. ?NOTE: This medicine is only for you. Do not share this medicine with others. ?What if I miss a dose? ?If you miss a dose, take it as soon as you can. If it is almost time for your next dose, take only that dose. Do not take double or extra doses. ?What may interact with this medication? ?Do not take this medicine with any of the following medications: ?epoetin alfa ?This list may not describe all possible interactions. Give your health care provider a list of all the medicines, herbs, non-prescription drugs, or dietary supplements you use. Also tell them if you smoke, drink alcohol, or use illegal drugs. Some items may interact with your medicine. ?What should I watch for while using this medication? ?Your condition will be monitored carefully while you are receiving this medicine. ?You may need blood work done while you are taking this medicine. ?This medicine may cause a decrease in vitamin B6. You should make sure that you get enough vitamin B6 while you are taking this medicine. Discuss the foods you eat and the vitamins you take with your health care professional. ?What side effects may I notice from receiving this medication? ?Side effects that you should report to your doctor or health care professional as soon as possible: ?allergic reactions like skin rash, itching or hives, swelling of the face, lips, or tongue ?breathing problems ?changes in vision ?chest pain ?confusion, trouble speaking or understanding ?feeling faint or lightheaded, falls ?high blood   pressure ?muscle aches or pains ?pain, swelling, warmth in the leg ?rapid weight gain ?severe headaches ?sudden numbness or weakness of the face, arm or leg ?trouble walking, dizziness, loss of balance or coordination ?seizures (convulsions) ?swelling of the ankles, feet, hands ?unusually weak or tired ?Side effects that usually do not require  medical attention (report to your doctor or health care professional if they continue or are bothersome): ?diarrhea ?fever, chills (flu-like symptoms) ?headaches ?nausea, vomiting ?redness, stinging, or swelling at site where injected ?This list may not describe all possible side effects. Call your doctor for medical advice about side effects. You may report side effects to FDA at 1-800-FDA-1088. ?Where should I keep my medication? ?Keep out of the reach of children. ?Store in a refrigerator between 2 and 8 degrees C (36 and 46 degrees F). Do not freeze. Do not shake. Throw away any unused portion if using a single-dose vial. Throw away any unused medicine after the expiration date. ?NOTE: This sheet is a summary. It may not cover all possible information. If you have questions about this medicine, talk to your doctor, pharmacist, or health care provider. ?? 2022 Elsevier/Gold Standard (2017-02-13 00:00:00) ? ?

## 2021-04-28 ENCOUNTER — Encounter (INDEPENDENT_AMBULATORY_CARE_PROVIDER_SITE_OTHER): Payer: 59 | Admitting: Ophthalmology

## 2021-05-03 ENCOUNTER — Other Ambulatory Visit: Payer: Self-pay

## 2021-05-03 ENCOUNTER — Encounter (INDEPENDENT_AMBULATORY_CARE_PROVIDER_SITE_OTHER): Payer: 59 | Admitting: Ophthalmology

## 2021-05-03 DIAGNOSIS — E113513 Type 2 diabetes mellitus with proliferative diabetic retinopathy with macular edema, bilateral: Secondary | ICD-10-CM | POA: Diagnosis not present

## 2021-05-03 DIAGNOSIS — H35373 Puckering of macula, bilateral: Secondary | ICD-10-CM | POA: Diagnosis not present

## 2021-05-03 DIAGNOSIS — H43813 Vitreous degeneration, bilateral: Secondary | ICD-10-CM | POA: Diagnosis not present

## 2021-05-03 DIAGNOSIS — H35033 Hypertensive retinopathy, bilateral: Secondary | ICD-10-CM | POA: Diagnosis not present

## 2021-05-03 DIAGNOSIS — I10 Essential (primary) hypertension: Secondary | ICD-10-CM

## 2021-05-07 ENCOUNTER — Other Ambulatory Visit: Payer: Self-pay | Admitting: Nephrology

## 2021-05-07 DIAGNOSIS — I10 Essential (primary) hypertension: Secondary | ICD-10-CM

## 2021-05-07 DIAGNOSIS — N1831 Chronic kidney disease, stage 3a: Secondary | ICD-10-CM

## 2021-05-10 ENCOUNTER — Other Ambulatory Visit: Payer: Self-pay | Admitting: *Deleted

## 2021-05-10 DIAGNOSIS — D509 Iron deficiency anemia, unspecified: Secondary | ICD-10-CM

## 2021-05-11 ENCOUNTER — Other Ambulatory Visit: Payer: Self-pay

## 2021-05-11 ENCOUNTER — Inpatient Hospital Stay: Attending: Hematology and Oncology

## 2021-05-11 ENCOUNTER — Inpatient Hospital Stay

## 2021-05-11 ENCOUNTER — Encounter: Payer: Self-pay | Admitting: Hematology and Oncology

## 2021-05-11 VITALS — BP 141/93 | HR 84 | Temp 98.5°F | Resp 18

## 2021-05-11 DIAGNOSIS — Z79899 Other long term (current) drug therapy: Secondary | ICD-10-CM | POA: Insufficient documentation

## 2021-05-11 DIAGNOSIS — D509 Iron deficiency anemia, unspecified: Secondary | ICD-10-CM | POA: Diagnosis present

## 2021-05-11 LAB — CBC WITH DIFFERENTIAL (CANCER CENTER ONLY)
Abs Immature Granulocytes: 0.02 10*3/uL (ref 0.00–0.07)
Basophils Absolute: 0.1 10*3/uL (ref 0.0–0.1)
Basophils Relative: 1 %
Eosinophils Absolute: 0.2 10*3/uL (ref 0.0–0.5)
Eosinophils Relative: 2 %
HCT: 32.5 % — ABNORMAL LOW (ref 36.0–46.0)
Hemoglobin: 10.7 g/dL — ABNORMAL LOW (ref 12.0–15.0)
Immature Granulocytes: 0 %
Lymphocytes Relative: 29 %
Lymphs Abs: 2.1 10*3/uL (ref 0.7–4.0)
MCH: 31.7 pg (ref 26.0–34.0)
MCHC: 32.9 g/dL (ref 30.0–36.0)
MCV: 96.2 fL (ref 80.0–100.0)
Monocytes Absolute: 0.4 10*3/uL (ref 0.1–1.0)
Monocytes Relative: 5 %
Neutro Abs: 4.5 10*3/uL (ref 1.7–7.7)
Neutrophils Relative %: 63 %
Platelet Count: 277 10*3/uL (ref 150–400)
RBC: 3.38 MIL/uL — ABNORMAL LOW (ref 3.87–5.11)
RDW: 13.2 % (ref 11.5–15.5)
WBC Count: 7.2 10*3/uL (ref 4.0–10.5)
nRBC: 0 % (ref 0.0–0.2)

## 2021-05-11 MED ORDER — DARBEPOETIN ALFA 300 MCG/0.6ML IJ SOSY
300.0000 ug | PREFILLED_SYRINGE | Freq: Once | INTRAMUSCULAR | Status: AC
  Administered 2021-05-11: 300 ug via SUBCUTANEOUS
  Filled 2021-05-11: qty 0.6

## 2021-05-11 NOTE — Patient Instructions (Signed)
Darbepoetin Alfa injection ?What is this medication? ?DARBEPOETIN ALFA (dar be POE e tin  AL fa) helps your body make more red blood cells. It is used to treat anemia caused by chronic kidney failure and chemotherapy. ?This medicine may be used for other purposes; ask your health care provider or pharmacist if you have questions. ?COMMON BRAND NAME(S): Aranesp ?What should I tell my care team before I take this medication? ?They need to know if you have any of these conditions: ?blood clotting disorders or history of blood clots ?cancer patient not on chemotherapy ?cystic fibrosis ?heart disease, such as angina, heart failure, or a history of a heart attack ?hemoglobin level of 12 g/dL or greater ?high blood pressure ?low levels of folate, iron, or vitamin B12 ?seizures ?an unusual or allergic reaction to darbepoetin, erythropoietin, albumin, hamster proteins, latex, other medicines, foods, dyes, or preservatives ?pregnant or trying to get pregnant ?breast-feeding ?How should I use this medication? ?This medicine is for injection into a vein or under the skin. It is usually given by a health care professional in a hospital or clinic setting. ?If you get this medicine at home, you will be taught how to prepare and give this medicine. Use exactly as directed. Take your medicine at regular intervals. Do not take your medicine more often than directed. ?It is important that you put your used needles and syringes in a special sharps container. Do not put them in a trash can. If you do not have a sharps container, call your pharmacist or healthcare provider to get one. ?A special MedGuide will be given to you by the pharmacist with each prescription and refill. Be sure to read this information carefully each time. ?Talk to your pediatrician regarding the use of this medicine in children. While this medicine may be used in children as young as 1 month of age for selected conditions, precautions do apply. ?Overdosage: If  you think you have taken too much of this medicine contact a poison control center or emergency room at once. ?NOTE: This medicine is only for you. Do not share this medicine with others. ?What if I miss a dose? ?If you miss a dose, take it as soon as you can. If it is almost time for your next dose, take only that dose. Do not take double or extra doses. ?What may interact with this medication? ?Do not take this medicine with any of the following medications: ?epoetin alfa ?This list may not describe all possible interactions. Give your health care provider a list of all the medicines, herbs, non-prescription drugs, or dietary supplements you use. Also tell them if you smoke, drink alcohol, or use illegal drugs. Some items may interact with your medicine. ?What should I watch for while using this medication? ?Your condition will be monitored carefully while you are receiving this medicine. ?You may need blood work done while you are taking this medicine. ?This medicine may cause a decrease in vitamin B6. You should make sure that you get enough vitamin B6 while you are taking this medicine. Discuss the foods you eat and the vitamins you take with your health care professional. ?What side effects may I notice from receiving this medication? ?Side effects that you should report to your doctor or health care professional as soon as possible: ?allergic reactions like skin rash, itching or hives, swelling of the face, lips, or tongue ?breathing problems ?changes in vision ?chest pain ?confusion, trouble speaking or understanding ?feeling faint or lightheaded, falls ?high blood   pressure ?muscle aches or pains ?pain, swelling, warmth in the leg ?rapid weight gain ?severe headaches ?sudden numbness or weakness of the face, arm or leg ?trouble walking, dizziness, loss of balance or coordination ?seizures (convulsions) ?swelling of the ankles, feet, hands ?unusually weak or tired ?Side effects that usually do not require  medical attention (report to your doctor or health care professional if they continue or are bothersome): ?diarrhea ?fever, chills (flu-like symptoms) ?headaches ?nausea, vomiting ?redness, stinging, or swelling at site where injected ?This list may not describe all possible side effects. Call your doctor for medical advice about side effects. You may report side effects to FDA at 1-800-FDA-1088. ?Where should I keep my medication? ?Keep out of the reach of children. ?Store in a refrigerator between 2 and 8 degrees C (36 and 46 degrees F). Do not freeze. Do not shake. Throw away any unused portion if using a single-dose vial. Throw away any unused medicine after the expiration date. ?NOTE: This sheet is a summary. It may not cover all possible information. If you have questions about this medicine, talk to your doctor, pharmacist, or health care provider. ?? 2022 Elsevier/Gold Standard (2017-02-13 00:00:00) ? ?

## 2021-05-28 ENCOUNTER — Other Ambulatory Visit: Payer: Self-pay | Admitting: *Deleted

## 2021-05-28 DIAGNOSIS — D509 Iron deficiency anemia, unspecified: Secondary | ICD-10-CM

## 2021-05-31 ENCOUNTER — Encounter (INDEPENDENT_AMBULATORY_CARE_PROVIDER_SITE_OTHER): Payer: 59 | Admitting: Ophthalmology

## 2021-05-31 DIAGNOSIS — H35373 Puckering of macula, bilateral: Secondary | ICD-10-CM

## 2021-05-31 DIAGNOSIS — H35033 Hypertensive retinopathy, bilateral: Secondary | ICD-10-CM | POA: Diagnosis not present

## 2021-05-31 DIAGNOSIS — I10 Essential (primary) hypertension: Secondary | ICD-10-CM

## 2021-05-31 DIAGNOSIS — E113513 Type 2 diabetes mellitus with proliferative diabetic retinopathy with macular edema, bilateral: Secondary | ICD-10-CM

## 2021-05-31 DIAGNOSIS — H43813 Vitreous degeneration, bilateral: Secondary | ICD-10-CM

## 2021-06-01 ENCOUNTER — Inpatient Hospital Stay

## 2021-06-08 NOTE — Progress Notes (Incomplete)
? ?Patient Care Team: ?Lindaann PascalLong, Scott, PA-C as PCP - General (Physician Assistant) ?Marva PandaMillsaps, Kimberly, NP ? ?DIAGNOSIS: No diagnosis found. ? ?SUMMARY OF ONCOLOGIC HISTORY: ?Oncology History  ? No history exists.  ? ? ?CHIEF COMPLIANT:  ? ?INTERVAL HISTORY: Kimberly Young is a ?41 y.o. female with above-mentioned history of anemia. She presents to the clinic today for follow-up. ? ?ALLERGIES:  is allergic to seasonal ic [cholestatin]. ? ?MEDICATIONS:  ?Current Outpatient Medications  ?Medication Sig Dispense Refill  ? amLODipine (NORVASC) 2.5 MG tablet Take 2.5 mg by mouth daily.    ? Continuous Blood Gluc Sensor (DEXCOM G6 SENSOR) MISC See admin instructions.    ? Continuous Blood Gluc Transmit (DEXCOM G6 TRANSMITTER) MISC See admin instructions.    ? insulin degludec (TRESIBA FLEXTOUCH) 200 UNIT/ML FlexTouch Pen Tresiba FlexTouch U-200 insulin 200 unit/mL (3 mL) subcutaneous pen    ? Insulin Syringe-Needle U-100 31G X 5/16" 0.3 ML MISC BD Ultra-Fine Short Pen Needle 31 gauge x 5/16" ? USE AS DIRECTED TO INJECT TRESIBA INSULIN SUBCUTANEOUSLY AT BEDTIME    ? Iron-FA-B Cmp-C-Biot-Probiotic (FUSION PLUS) CAPS Take 1 capsule by mouth daily.    ? omeprazole (PRILOSEC) 40 MG capsule TAKE 1 CAPSULE (40 MG TOTAL) BY MOUTH IN THE MORNING AND AT BEDTIME. 60 capsule 1  ? rosuvastatin (CRESTOR) 10 MG tablet rosuvastatin 10 mg tablet ? TAKE 1 TABLET BY MOUTH EVERY DAY    ? RYBELSUS 7 MG TABS Take 1 tablet by mouth daily.    ? ?No current facility-administered medications for this visit.  ? ? ?PHYSICAL EXAMINATION: ?ECOG PERFORMANCE STATUS: {CHL ONC ECOG WU:[id number]}[phone]} ? ?There were no vitals filed for this visit. ?There were no vitals filed for this visit. ? ?BREAST:*** No palpable masses or nodules in either right or left breasts. No palpable axillary supraclavicular or infraclavicular adenopathy no breast tenderness or nipple discharge. (exam performed in the presence of a chaperone) ? ?LABORATORY DATA:  ?I have reviewed the  data as listed ? ?  Latest Ref Rng & Units 12/02/2020  ?  8:50 AM 10/02/2015  ?  2:20 PM  ?CMP  ?Glucose 70 - 99 mg/dL 956205   [id number]    ?BUN 6 - 20 mg/dL 18   9    ?Creatinine 0.44 - 1.00 mg/dL 0.860.91   5.780.40    ?Sodium 135 - 145 mmol/L 135   134    ?Potassium 3.5 - 5.1 mmol/L 4.4   4.0    ?Chloride 98 - 111 mmol/L 108   97    ?CO2 22 - 32 mmol/L 21     ?Calcium 8.9 - 10.3 mg/dL 8.2     ?Total Protein 6.5 - 8.1 g/dL 6.1     ?Total Bilirubin 0.3 - 1.2 mg/dL 0.2     ?Alkaline Phos 38 - 126 U/L 54     ?AST 15 - 41 U/L 18     ?ALT 0 - 44 U/L 18     ? ? ?Lab Results  ?Component Value Date  ? WBC 7.2 05/11/2021  ? HGB 10.7 (L) 05/11/2021  ? HCT 32.5 (L) 05/11/2021  ? MCV 96.2 05/11/2021  ? PLT 277 05/11/2021  ? NEUTROABS 4.5 05/11/2021  ? ? ?ASSESSMENT & PLAN:  ?No problem-specific Assessment & Plan notes found for this encounter. ? ? ? ?No orders of the defined types were placed in this encounter. ? ?The patient has a good understanding of the overall plan. she agrees with it. she will call with any problems  that may develop before the next visit here. ?Total time spent: 30 mins including face to face time and time spent for planning, charting and co-ordination of care ? ? Kimberly Young, CMA ?06/08/21 ? ? ? I Audreyanna Butkiewicz, George Haggart am scribing for Dr. Lindi Adie ? ?***  ?

## 2021-06-22 ENCOUNTER — Inpatient Hospital Stay: Payer: 59 | Admitting: Hematology and Oncology

## 2021-06-22 ENCOUNTER — Inpatient Hospital Stay: Payer: 59

## 2021-06-22 ENCOUNTER — Inpatient Hospital Stay: Payer: 59 | Attending: Hematology and Oncology

## 2021-06-22 NOTE — Assessment & Plan Note (Deleted)
03/22/2021: Hemoglobin 8.6, MCV 93.4, RDW 12.1, platelets 340, WBC 10.6, glucose 249, creatinine 1.27, calcium 8.2, albumin 2.9, ferritin 252, iron saturation 38%, celiac antibodies negative, B12 670, folate greater than 24 A1c 7.3  Lab review: Hemoglobin 8.5, MCV 91.2, erythropoietin 9.2, B12 735, folate 81.7, iron saturation 46%, hemoglobin electrophoresis: Normal, TSH 2, reticulocyte absolute 74.8 LDH 185: No evidence of hemolysis  Final diagnosis: Anemia of chronic disease Current treatment: Aranesp every 3 weeks started 04/20/2021 (goal: To get hemoglobin over 10)

## 2021-06-28 ENCOUNTER — Encounter (INDEPENDENT_AMBULATORY_CARE_PROVIDER_SITE_OTHER): Payer: 59 | Admitting: Ophthalmology

## 2021-06-28 ENCOUNTER — Inpatient Hospital Stay: Payer: 59 | Admitting: Family

## 2021-06-28 ENCOUNTER — Inpatient Hospital Stay: Payer: 59 | Attending: Hematology & Oncology

## 2021-06-29 ENCOUNTER — Ambulatory Visit
Admission: RE | Admit: 2021-06-29 | Discharge: 2021-06-29 | Disposition: A | Discharge status: Home / Self Care | Payer: 59 | Source: Ambulatory Visit | Attending: Nephrology | Admitting: Nephrology

## 2021-06-29 DIAGNOSIS — N1831 Chronic kidney disease, stage 3a: Secondary | ICD-10-CM

## 2021-06-29 DIAGNOSIS — I10 Essential (primary) hypertension: Secondary | ICD-10-CM

## 2021-07-08 ENCOUNTER — Encounter: Payer: Self-pay | Admitting: Hematology and Oncology

## 2021-07-08 ENCOUNTER — Encounter (INDEPENDENT_AMBULATORY_CARE_PROVIDER_SITE_OTHER): Admitting: Ophthalmology

## 2021-07-08 DIAGNOSIS — E113513 Type 2 diabetes mellitus with proliferative diabetic retinopathy with macular edema, bilateral: Secondary | ICD-10-CM | POA: Diagnosis not present

## 2021-07-08 DIAGNOSIS — H2513 Age-related nuclear cataract, bilateral: Secondary | ICD-10-CM

## 2021-07-08 DIAGNOSIS — H43813 Vitreous degeneration, bilateral: Secondary | ICD-10-CM | POA: Diagnosis not present

## 2021-07-08 DIAGNOSIS — H35033 Hypertensive retinopathy, bilateral: Secondary | ICD-10-CM | POA: Diagnosis not present

## 2021-07-08 DIAGNOSIS — I10 Essential (primary) hypertension: Secondary | ICD-10-CM | POA: Diagnosis not present

## 2021-07-29 ENCOUNTER — Encounter: Payer: Self-pay | Admitting: Hematology and Oncology

## 2021-08-05 ENCOUNTER — Encounter (INDEPENDENT_AMBULATORY_CARE_PROVIDER_SITE_OTHER): Admitting: Ophthalmology

## 2021-08-05 ENCOUNTER — Encounter: Payer: Self-pay | Admitting: Hematology and Oncology

## 2021-08-05 DIAGNOSIS — H43813 Vitreous degeneration, bilateral: Secondary | ICD-10-CM | POA: Diagnosis not present

## 2021-08-05 DIAGNOSIS — I10 Essential (primary) hypertension: Secondary | ICD-10-CM | POA: Diagnosis not present

## 2021-08-05 DIAGNOSIS — H35033 Hypertensive retinopathy, bilateral: Secondary | ICD-10-CM

## 2021-08-05 DIAGNOSIS — E113513 Type 2 diabetes mellitus with proliferative diabetic retinopathy with macular edema, bilateral: Secondary | ICD-10-CM

## 2021-08-05 DIAGNOSIS — H35372 Puckering of macula, left eye: Secondary | ICD-10-CM

## 2021-09-02 ENCOUNTER — Encounter (INDEPENDENT_AMBULATORY_CARE_PROVIDER_SITE_OTHER): Admitting: Ophthalmology

## 2022-05-30 IMAGING — US US RENAL
1 series · 14 of 25 positions shown · non-contrast
Comparison: None Available.

CLINICAL DATA: Chronic renal disease

EXAM:
RENAL / URINARY TRACT ULTRASOUND COMPLETE

[Series 1: us renal · 0.25mm/px · 14 of 52 slices shown]
[im 1/52]
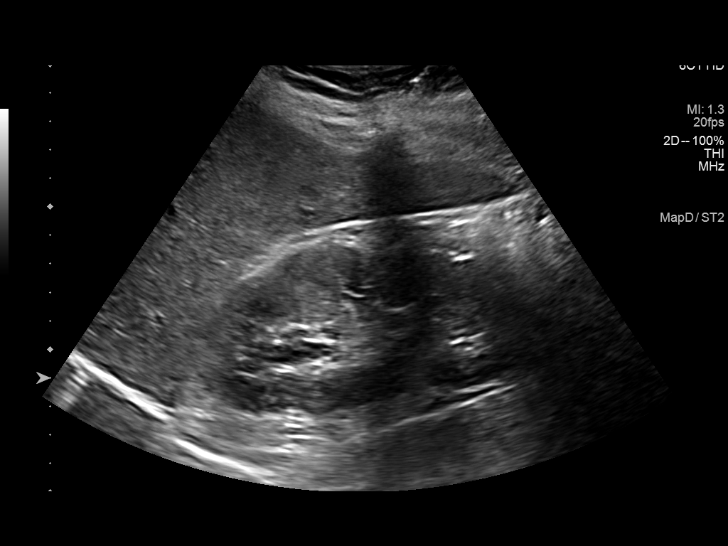
[im 5/52]
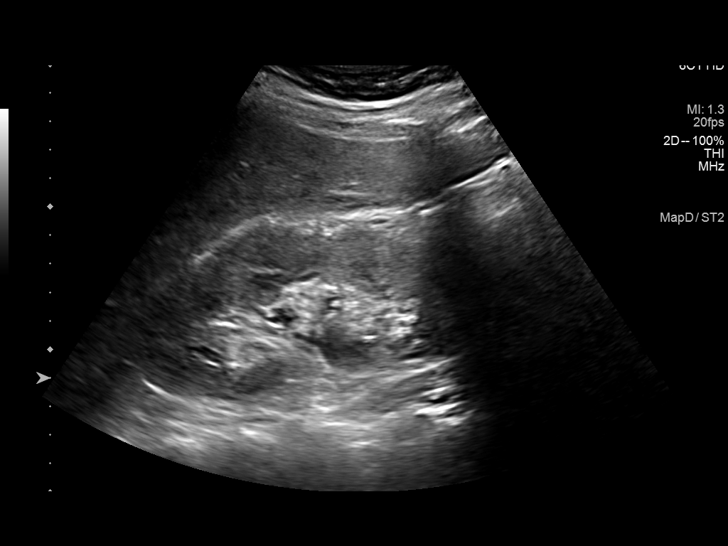
[im 9/52]
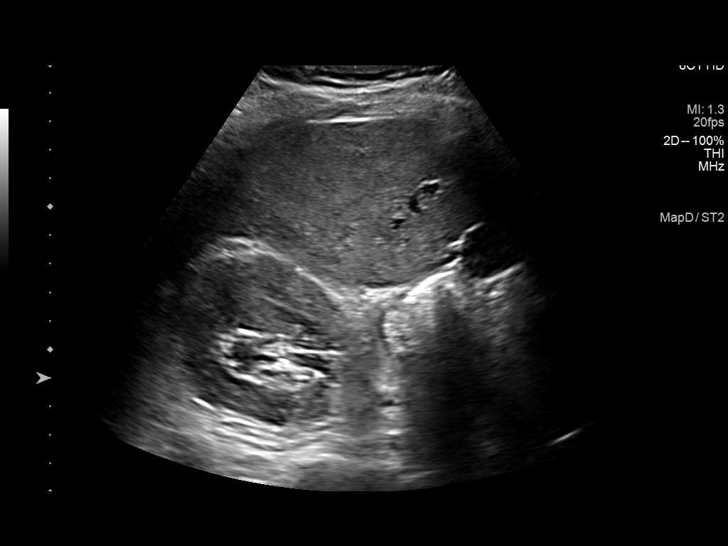
[im 13/52]
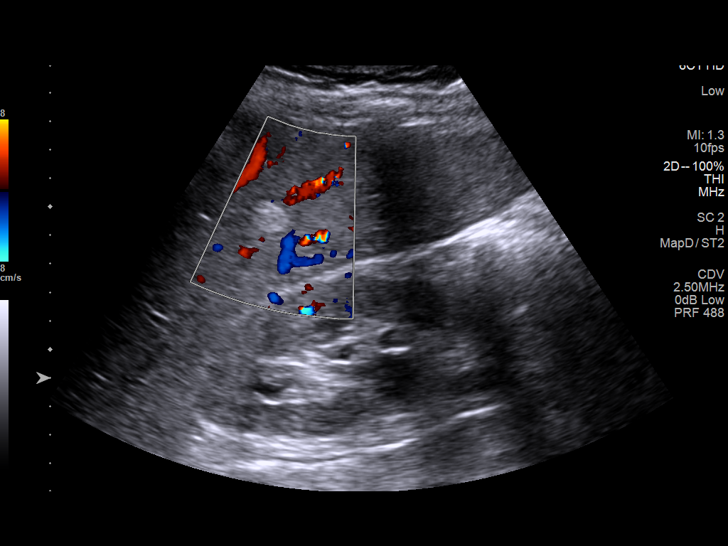
[im 18/52]
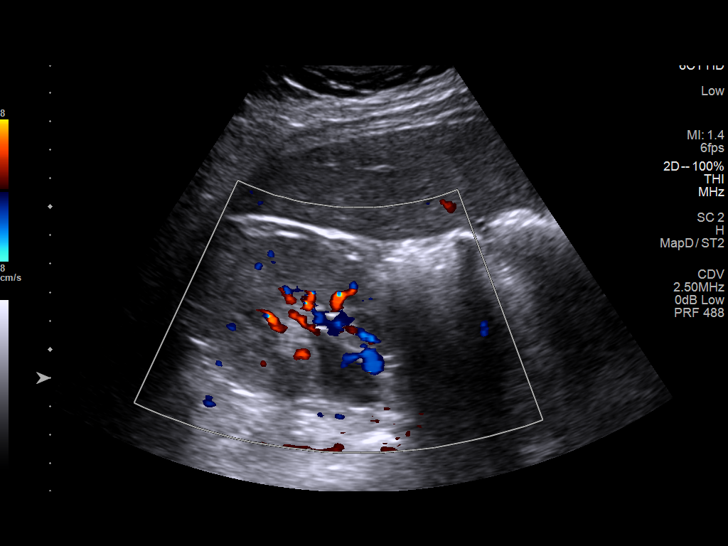
[im 20/52]
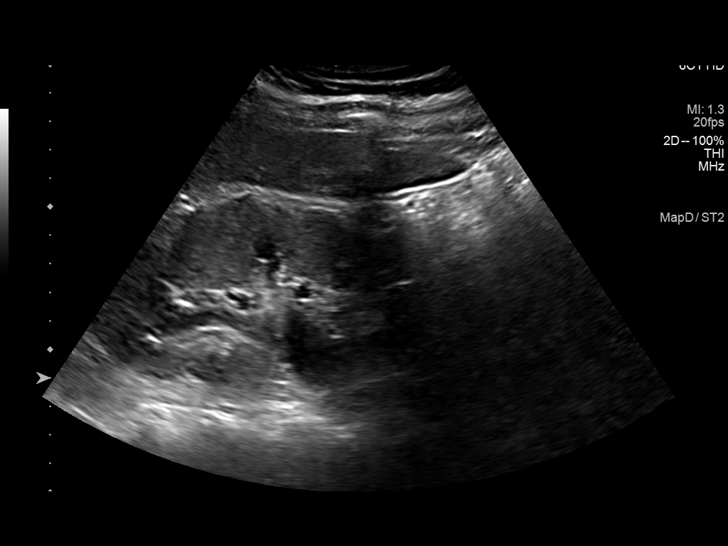
[im 24/52]
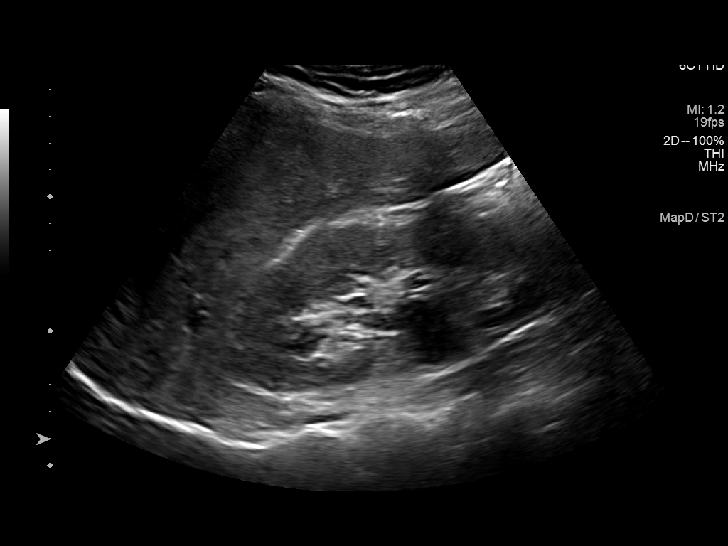
[im 28/52]
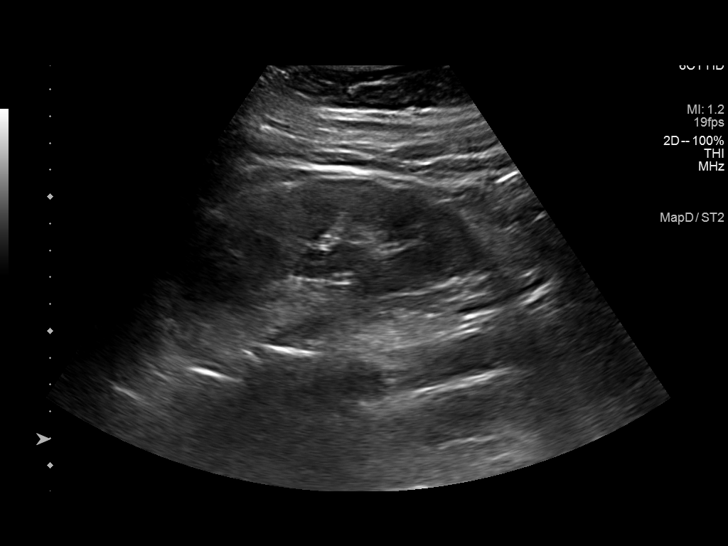
[im 32/52]
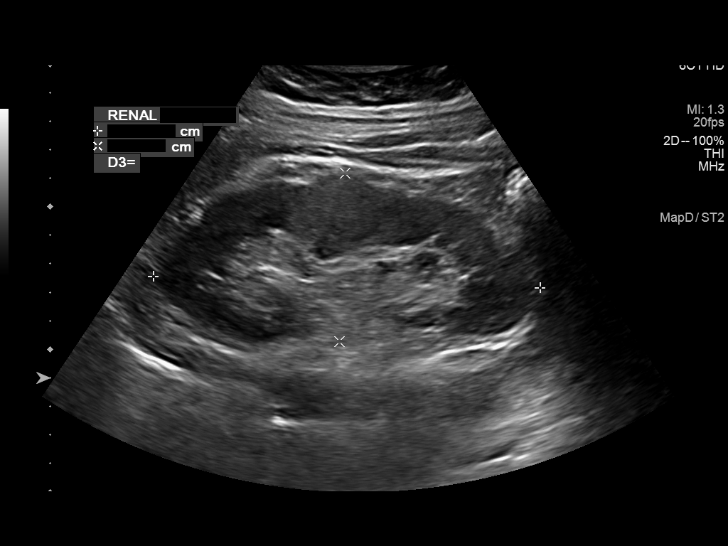
[im 35/52]
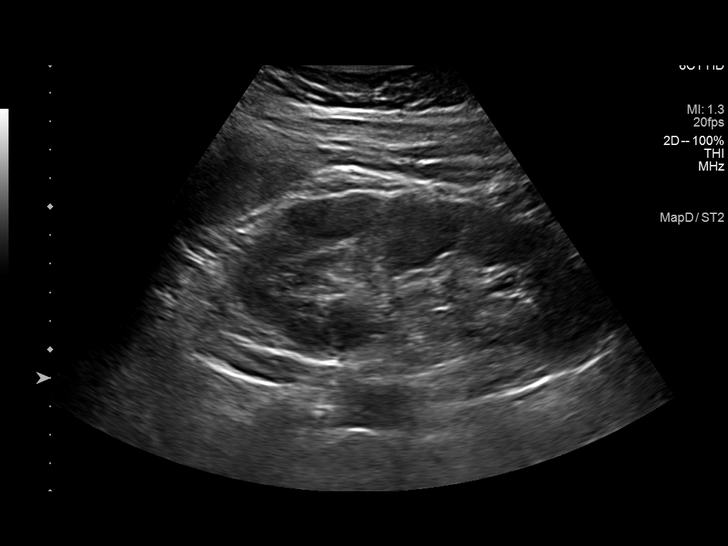
[im 39/52]
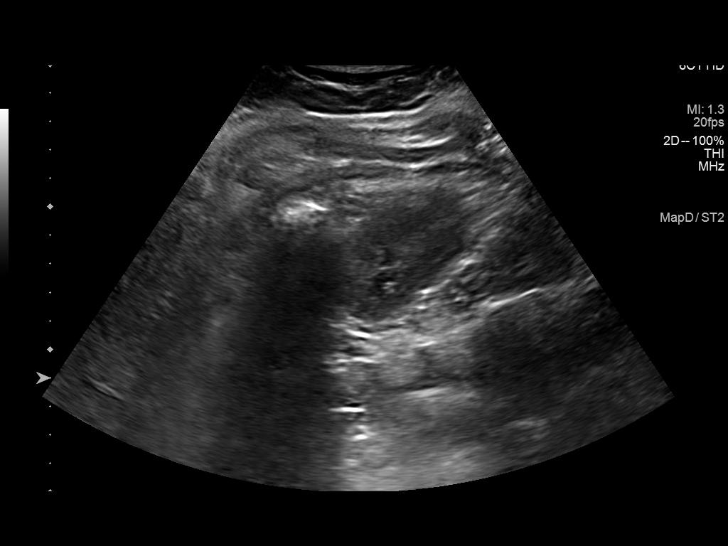
[im 43/52]
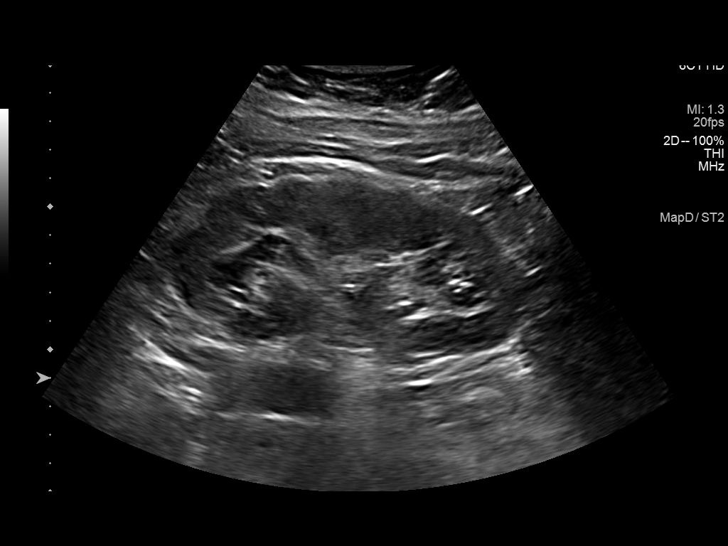
[im 47/52]
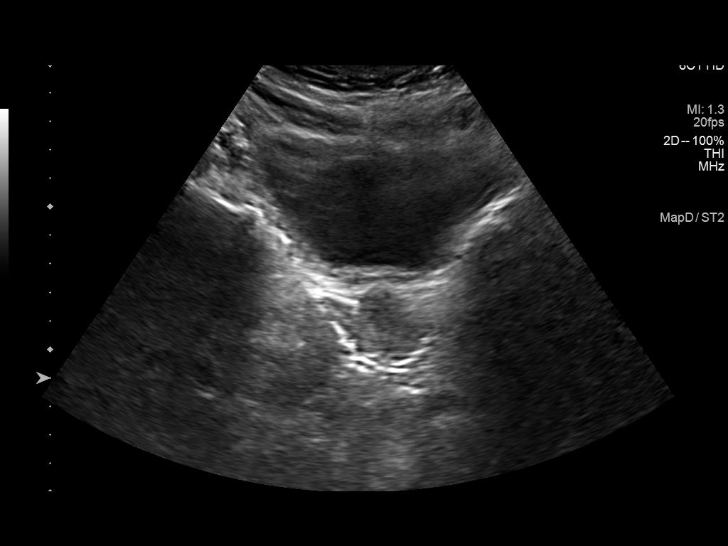
[im 52/52]
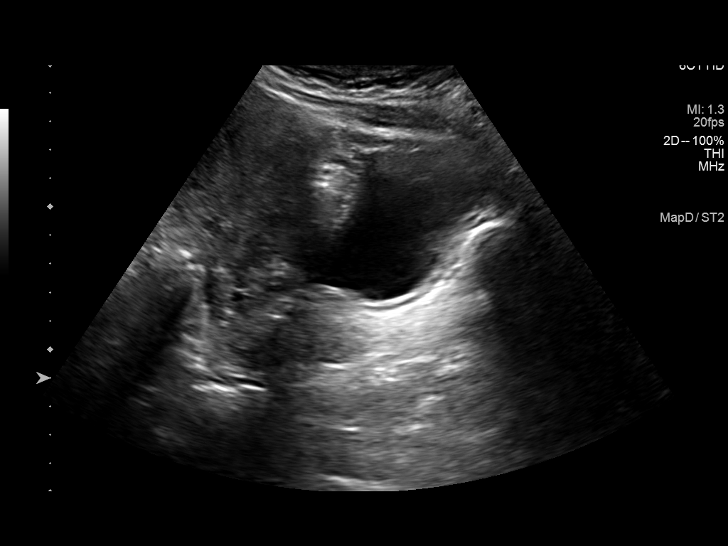

[14 of 25 positions shown; findings below may reference images not displayed]

FINDINGS: Right Kidney:

Renal measurements: 12.9 x 5.9 x 5.4 cm = volume: 216 mL. Mild
hydronephrosis.

Left Kidney:

Renal measurements: 13.6 x 5.9 x 6.8 cm = volume: 287 mL. No
significant hydronephrosis.

Bladder:

Appears normal for degree of bladder distention.

Other:

1.4 cm hemangioma in the liver incidentally identified.
IMPRESSION: 1. Mild right hydronephrosis of uncertain etiology and chronicity.
2. The kidneys and bladder are otherwise normal.
3. 1.4 cm probable hemangioma in the right hepatic lobe.
